# Patient Record
Sex: Male | Born: 1970 | Race: White | Hispanic: No | Marital: Married
Health system: Midwestern US, Community
[De-identification: ages and names within clinical notes are randomized; demographics above are authoritative.]

## PROBLEM LIST (undated history)

## (undated) DIAGNOSIS — J189 Pneumonia, unspecified organism: Secondary | ICD-10-CM

## (undated) DIAGNOSIS — I10 Essential (primary) hypertension: Secondary | ICD-10-CM

## (undated) DIAGNOSIS — K219 Gastro-esophageal reflux disease without esophagitis: Secondary | ICD-10-CM

## (undated) DIAGNOSIS — IMO0002 Reserved for concepts with insufficient information to code with codable children: Secondary | ICD-10-CM

## (undated) DIAGNOSIS — M549 Dorsalgia, unspecified: Secondary | ICD-10-CM

## (undated) DIAGNOSIS — I499 Cardiac arrhythmia, unspecified: Secondary | ICD-10-CM

## (undated) DIAGNOSIS — K621 Rectal polyp: Secondary | ICD-10-CM

## (undated) DIAGNOSIS — M545 Low back pain, unspecified: Secondary | ICD-10-CM

## (undated) DIAGNOSIS — I509 Heart failure, unspecified: Secondary | ICD-10-CM

## (undated) DIAGNOSIS — I729 Aneurysm of unspecified site: Secondary | ICD-10-CM

## (undated) DIAGNOSIS — G8929 Other chronic pain: Secondary | ICD-10-CM

## (undated) HISTORY — PX: LACERATION REPAIR: SHX5168

## (undated) HISTORY — PX: GROIN EXPLORATION: SHX1713

## (undated) HISTORY — PX: OTHER SURGICAL HISTORY: SHX169

## (undated) HISTORY — PX: CHOLECYSTECTOMY: SHX55

---

## 2002-01-25 ENCOUNTER — Emergency Department (HOSPITAL_COMMUNITY): Admission: EM | Admit: 2002-01-25 | Discharge: 2002-01-25 | Payer: Self-pay | Admitting: Emergency Medicine

## 2002-05-19 ENCOUNTER — Emergency Department (HOSPITAL_COMMUNITY): Admission: EM | Admit: 2002-05-19 | Discharge: 2002-05-19 | Payer: Self-pay | Admitting: Emergency Medicine

## 2002-07-15 ENCOUNTER — Emergency Department (HOSPITAL_COMMUNITY): Admission: EM | Admit: 2002-07-15 | Discharge: 2002-07-15 | Payer: Self-pay | Admitting: Emergency Medicine

## 2002-07-15 ENCOUNTER — Encounter: Payer: Self-pay | Admitting: Emergency Medicine

## 2002-08-06 ENCOUNTER — Emergency Department (HOSPITAL_COMMUNITY): Admission: EM | Admit: 2002-08-06 | Discharge: 2002-08-06 | Payer: Self-pay | Admitting: *Deleted

## 2002-08-06 ENCOUNTER — Encounter: Payer: Self-pay | Admitting: *Deleted

## 2002-09-13 ENCOUNTER — Emergency Department (HOSPITAL_COMMUNITY): Admission: EM | Admit: 2002-09-13 | Discharge: 2002-09-13 | Payer: Self-pay | Admitting: Emergency Medicine

## 2002-10-09 ENCOUNTER — Emergency Department (HOSPITAL_COMMUNITY): Admission: EM | Admit: 2002-10-09 | Discharge: 2002-10-09 | Payer: Self-pay | Admitting: Emergency Medicine

## 2002-12-23 ENCOUNTER — Emergency Department (HOSPITAL_COMMUNITY): Admission: EM | Admit: 2002-12-23 | Discharge: 2002-12-23 | Payer: Self-pay | Admitting: Emergency Medicine

## 2003-01-13 ENCOUNTER — Emergency Department (HOSPITAL_COMMUNITY): Admission: EM | Admit: 2003-01-13 | Discharge: 2003-01-13 | Payer: Self-pay | Admitting: Emergency Medicine

## 2003-01-30 ENCOUNTER — Emergency Department (HOSPITAL_COMMUNITY): Admission: EM | Admit: 2003-01-30 | Discharge: 2003-01-30 | Payer: Self-pay | Admitting: Emergency Medicine

## 2003-03-26 ENCOUNTER — Inpatient Hospital Stay (HOSPITAL_COMMUNITY): Admission: AD | Admit: 2003-03-26 | Discharge: 2003-03-29 | Payer: Self-pay | Admitting: General Surgery

## 2003-04-05 ENCOUNTER — Emergency Department (HOSPITAL_COMMUNITY): Admission: EM | Admit: 2003-04-05 | Discharge: 2003-04-05 | Payer: Self-pay | Admitting: Emergency Medicine

## 2003-05-04 ENCOUNTER — Emergency Department (HOSPITAL_COMMUNITY): Admission: EM | Admit: 2003-05-04 | Discharge: 2003-05-04 | Payer: Self-pay | Admitting: Emergency Medicine

## 2003-05-16 ENCOUNTER — Emergency Department (HOSPITAL_COMMUNITY): Admission: EM | Admit: 2003-05-16 | Discharge: 2003-05-16 | Payer: Self-pay | Admitting: Emergency Medicine

## 2003-05-31 ENCOUNTER — Emergency Department (HOSPITAL_COMMUNITY): Admission: EM | Admit: 2003-05-31 | Discharge: 2003-05-31 | Payer: Self-pay | Admitting: *Deleted

## 2003-07-04 ENCOUNTER — Emergency Department (HOSPITAL_COMMUNITY): Admission: EM | Admit: 2003-07-04 | Discharge: 2003-07-04 | Payer: Self-pay | Admitting: Emergency Medicine

## 2003-08-30 ENCOUNTER — Encounter: Admission: RE | Admit: 2003-08-30 | Discharge: 2003-08-30 | Payer: Self-pay | Admitting: Neurosurgery

## 2003-11-08 ENCOUNTER — Encounter: Admission: RE | Admit: 2003-11-08 | Discharge: 2003-11-08 | Payer: Self-pay | Admitting: Neurosurgery

## 2003-12-20 ENCOUNTER — Encounter: Admission: RE | Admit: 2003-12-20 | Discharge: 2003-12-20 | Payer: Self-pay | Admitting: Neurosurgery

## 2004-11-10 ENCOUNTER — Emergency Department (HOSPITAL_COMMUNITY): Admission: EM | Admit: 2004-11-10 | Discharge: 2004-11-10 | Payer: Self-pay | Admitting: Emergency Medicine

## 2005-01-19 ENCOUNTER — Emergency Department (HOSPITAL_COMMUNITY): Admission: EM | Admit: 2005-01-19 | Discharge: 2005-01-19 | Payer: Self-pay | Admitting: Emergency Medicine

## 2005-03-26 ENCOUNTER — Ambulatory Visit (HOSPITAL_COMMUNITY)
Admission: RE | Admit: 2005-03-26 | Discharge: 2005-03-26 | Payer: Self-pay | Admitting: Physical Medicine and Rehabilitation

## 2006-01-10 ENCOUNTER — Emergency Department (HOSPITAL_COMMUNITY): Admission: EM | Admit: 2006-01-10 | Discharge: 2006-01-10 | Payer: Self-pay | Admitting: Emergency Medicine

## 2006-05-03 ENCOUNTER — Emergency Department (HOSPITAL_COMMUNITY): Admission: EM | Admit: 2006-05-03 | Discharge: 2006-05-03 | Payer: Self-pay | Admitting: Emergency Medicine

## 2006-06-07 ENCOUNTER — Emergency Department (HOSPITAL_COMMUNITY): Admission: EM | Admit: 2006-06-07 | Discharge: 2006-06-07 | Payer: Self-pay | Admitting: Emergency Medicine

## 2006-08-26 ENCOUNTER — Emergency Department (HOSPITAL_COMMUNITY): Admission: EM | Admit: 2006-08-26 | Discharge: 2006-08-26 | Payer: Self-pay | Admitting: Emergency Medicine

## 2007-02-19 ENCOUNTER — Emergency Department (HOSPITAL_COMMUNITY): Admission: EM | Admit: 2007-02-19 | Discharge: 2007-02-19 | Payer: Self-pay | Admitting: Emergency Medicine

## 2008-06-27 ENCOUNTER — Observation Stay (HOSPITAL_COMMUNITY): Admission: EM | Admit: 2008-06-27 | Discharge: 2008-06-29 | Payer: Self-pay | Admitting: Emergency Medicine

## 2009-09-20 ENCOUNTER — Emergency Department (HOSPITAL_COMMUNITY): Admission: EM | Admit: 2009-09-20 | Discharge: 2009-09-20 | Payer: Self-pay | Admitting: Emergency Medicine

## 2010-02-22 ENCOUNTER — Encounter: Payer: Self-pay | Admitting: Neurosurgery

## 2010-02-22 ENCOUNTER — Encounter: Payer: Self-pay | Admitting: Physical Medicine and Rehabilitation

## 2010-04-16 LAB — URINALYSIS, ROUTINE W REFLEX MICROSCOPIC
Glucose, UA: NEGATIVE mg/dL
Specific Gravity, Urine: >1.03 — ABNORMAL HIGH (ref 1.005–1.030)
Urobilinogen, UA: 0.2 mg/dL (ref 0.0–1.0)

## 2010-04-16 LAB — BASIC METABOLIC PANEL
BUN: 14 mg/dL (ref 6–23)
CO2: 27 mEq/L (ref 19–32)
Calcium: 8.7 mg/dL (ref 8.4–10.5)
Chloride: 101 mEq/L (ref 96–112)
Creatinine, Ser: 0.88 mg/dL (ref 0.4–1.5)
GFR calc Af Amer: >60 mL/min (ref >60)
Glucose, Bld: 90 mg/dL (ref 70–99)
Sodium: 135 mEq/L (ref 135–145)

## 2010-04-16 LAB — DIFFERENTIAL
Basophils Relative: 0 % (ref 0–1)
Eosinophils Relative: 1 % (ref 0–5)
Lymphs Abs: 2.4 10*3/uL (ref 0.7–4.0)
Monocytes Absolute: 0.7 10*3/uL (ref 0.1–1.0)
Monocytes Relative: 5 % (ref 3–12)
Neutrophils Relative %: 76 % (ref 43–77)

## 2010-04-16 LAB — CBC
HCT: 42.6 % (ref 39.0–52.0)
MCV: 93.4 fL (ref 78.0–100.0)
RDW: 13.2 % (ref 11.5–15.5)

## 2010-04-16 LAB — URINE CULTURE: Culture  Setup Time: 201108212245

## 2010-05-12 LAB — POCT I-STAT 4, (NA,K, GLUC, HGB,HCT)
HCT: 36 % — ABNORMAL LOW (ref 39.0–52.0)
Hemoglobin: 12.2 g/dL — ABNORMAL LOW (ref 13.0–17.0)
Potassium: 4.3 mEq/L (ref 3.5–5.1)

## 2010-05-12 LAB — CBC
Hemoglobin: 14 g/dL (ref 13.0–17.0)
MCHC: 34.2 g/dL (ref 30.0–36.0)
RDW: 13.2 % (ref 11.5–15.5)
WBC: 10.3 10*3/uL (ref 4.0–10.5)

## 2010-05-12 LAB — DIFFERENTIAL
Basophils Absolute: 0 10*3/uL (ref 0.0–0.1)
Basophils Relative: 0 % (ref 0–1)
Eosinophils Absolute: 0.2 10*3/uL (ref 0.0–0.7)
Eosinophils Relative: 2 % (ref 0–5)
Lymphocytes Relative: 24 % (ref 12–46)
Lymphs Abs: 2.5 10*3/uL (ref 0.7–4.0)
Monocytes Relative: 6 % (ref 3–12)
Neutrophils Relative %: 67 % (ref 43–77)

## 2010-05-12 LAB — SAMPLE TO BLOOD BANK

## 2010-05-12 LAB — HEMOGLOBIN AND HEMATOCRIT, BLOOD: HCT: 36 % — ABNORMAL LOW (ref 39.0–52.0)

## 2010-06-16 NOTE — Op Note (Signed)
John Clark, John Clark NO.:  [id number]   MEDICAL RECORD NO.:  [id number]          PATIENT TYPE:  INP   LOCATION:  5005                         FACILITY:  [facility]   PHYSICIAN:  Madelynn Done, MD  DATE OF BIRTH:  [birth year]   DATE OF PROCEDURE:  06/27/2008  DATE OF DISCHARGE:                               OPERATIVE REPORT   PREOPERATIVE DIAGNOSIS:  Complex right forearm laceration, traumatic  laceration with tendon involvement.   POSTOPERATIVE DIAGNOSIS:  Complex right forearm laceration, traumatic  laceration with tendon involvement.   ATTENDING PHYSICIAN:  Dr. Sharma Covert IV, MD, who scrubbed and was  present for the entire procedure.   SURGICAL PROCEDURES:  1. Repair of complex traumatic laceration measuring 23 cm.  2. Repair right forearm ulnar artery, primary arterial repair      peripheral artery.  3. Repair right FCU muscle tendon, right forearm flexor, wrist flexor.  4. Right forearm ulnar nerve exploration, and forearm decompression      and neurolysis.  5. Right median nerve forearm exploration, decompression, and      neurolysis.   ANESTHESIA:  General via endotracheal tube.   TOURNIQUET TIME:  Less than 1/2 hour, 250 mmHg.   INDICATIONS:  Mr. Sevillano is a 40 year old right-hand-dominant gentleman  who had a large piece of glass come down on his right forearm, presented  to EMS with obvious wounds to his forearm with significant hemorrhage.  He was seen and evaluated in the emergency department and given his  injury, it was recommended that he undergo the above procedure.  Risks,  benefits, and alternatives were discussed in detail with the patient and  signed informed consent was obtained.  Risks include but not limited to  bleeding, infection, damage to nearby nerves, arteries, or tendons, loss  of motion of the digits, persistent pain in the hand and need for  further surgical intervention.   DESCRIPTION OF PROCEDURE:  The patient was  brought and identified in the  preoperative holding area, mark with permanent marker was made on the  right forearm to indicate correct operative site.  The patient was  brought back to the operating room, placed supine on the anesthesia room  table.  General anesthesia was administered.  The patient tolerated this  well.  A well-padded tourniquet was then placed on the right brachium  and sealed with a 1000 drape.  The right upper extremities were then  prepped and draped in normal sterile fashion.  The traumatic laceration  was measured to be 23 cm in its great length.  This was then extended  proximally.  The tourniquet was insufflated.  Dissection was carried  down in the forearm.  The fascial layer was then incised.  Portions of  this was done distally in order to obtain a healthy area out of the zone  of injury.  After the release of the forearm fascia, the contents of the  forearm were explored.  The patient's laceration was in the proximal  third of the forearm involving the pronator teres, FCR, and FCU region.  It extended into the  FDS and FDP, but it did not.  The ulnar nerve was  then explored.  The ulnar nerve was in continuity proximally and  distally and a neurolysis was carried out and decompression in the level  of the forearm.  After this, attention was then turned to median nerve.  A similar technique decompression and neurolysis was then carried out of  the median nerve and this was noted to be in continuity.  After both  nerves were explored and decompressed, the tourniquet was then deflated.  The patient had a strong radial pulse.  The ulnar artery was lacerated,  both ends of the artery were then identified and using small vessel  clamps and the vessel was then repaired in the end with a 6-0 Prolene  suture.  The both ends of the artery were adequately prepared, blood  clot was removed from the proximal and distal ends.  There was good flow  and back flow at the  artery and using the Doppler, there was a good  waveform at the ulnar artery at the level of the wrist and a good flow  with Allen's testing following repair the ulnar artery.  Finally, the  attention was then turned to the FCU where the FCU was lacerated in the  proximal forearm.  This was repaired with a 4-0 FiberWire suture and  several horizontal figure-of-eight sutures.  The remaining muscle  bellies of the pronator teres and FCR were reapproximated with 2-0  Vicryl.  The wounds were then thoroughly irrigated.  The subcutaneous  tissues were then closed with a 2-0 Vicryl and the traumatic laceration  was then closed with Vicryl sutures in a layered closure as well as the  skin staples, 20 mL of 0.25% Marcaine was infiltrated.  A Xeroform  dressing and sterile compressive bandage was then applied.  The patient  was placed on a sugar tong splint, extubated and taken to recovery room  in good condition.   POSTOPERATIVE PLAN:  The patient will be admitted for overnight for IV  antibiotics, pain control will be discharged in the morning, seen back  in the office in 10 days.  No workup for the first 10 days and begin an  outpatient therapy program, getting back the use and function of the  right upper extremity.      Madelynn Done, MD  Electronically Signed     FWO/MEDQ  D:  06/27/2008  T:  06/28/2008  Job:  [id number]

## 2010-06-19 NOTE — Discharge Summary (Signed)
NAME:  John Clark, John Clark                          ACCOUNT NO.:  [id number]   MEDICAL RECORD NO.:  [id number]                   PATIENT TYPE:  INP   LOCATION:  A331                                 FACILITY:  [facility]   PHYSICIAN:  Dirk Dress. Katrinka Blazing, M.D.                DATE OF BIRTH:  29-Oct-1970   DATE OF ADMISSION:  03/26/2003  DATE OF DISCHARGE:  03/29/2003                                 DISCHARGE SUMMARY   DISCHARGE DIAGNOSES:  1. Acute gastritis.  2. Rectal polyps.  3. Internal hemorrhoids.  4. Gastroesophageal reflux disease.  5. Chronic thoracic and lumbar pain.   SPECIAL PROCEDURE:  1. Esophagogastroduodenoscopy.  2. Colonoscopy with biopsy of rectal polyp.   DISPOSITION:  The patient is discharged home in stable and satisfactory  condition.   DISCHARGE MEDICATIONS:  1. Protonix 40 mg daily.  2. Levsin 0.125 mg a.c. and h.s.  3. Tylox one or two every four hours as needed for pain.   DISCHARGE INSTRUCTIONS:  1. He is advised to have sitz baths twice a day.  2. Followup in the office in two weeks.   SUMMARY:  A 40 year old male who was admitted with recurrent abdominal pain  with rectal bleeding.  He had the onset of abdominal pain on Thursday of the  week prior to admission.  The pain was very severe. He had multiple episodes  of rectal bleeding over the next three days.  There was evidence of some  weight loss.  He was seen in the emergency room at [facility].  He  was told that he had gastroesophageal reflux and was discharged.  He became  symptomatic.  He was seen in the office where he was noted to be in severe  distress.  He was admitted for control of pain and for evaluation of bloody  diarrhea.  The patient was admitted, started on analgesics, IV fluids, IV  Protonix.  He was given IV Phenergan.  Ultrasound of the abdomen was normal.  EGD showed antral gastritis.  Colonoscopy showed rectal polyps and internal  hemorrhoids.  The patient improved over the next 24  hours.  He was  discharged home with plans for follow up as an outpatient.     ___________________________________________                                         Dirk Dress. Katrinka Blazing, M.D.   LCS/MEDQ  D:  04/27/2003  T:  04/28/2003  Job:  [id number]

## 2010-06-19 NOTE — H&P (Signed)
NAME:  John Clark, John Clark                          ACCOUNT NO.:  [id number]   MEDICAL RECORD NO.:  [id number]                   PATIENT TYPE:  INP   LOCATION:  A331                                 FACILITY:  [facility]   PHYSICIAN:  Dirk Dress. Katrinka Blazing, M.D.                DATE OF BIRTH:  10-Oct-1970   DATE OF ADMISSION:  03/26/2003  DATE OF DISCHARGE:                                HISTORY & PHYSICAL   HISTORY OF PRESENT ILLNESS:  The patient is a 40 year old male admitted for  recurrent abdominal pain with rectal bleeding.   The patient states that he had acute onset of abdominal pain on Thursday of  last week.  This was about the 17th of February.  The patient was very  severe.  He went to the bathroom and had rectal bleeding.  He had pain again  on the 18th, 19th and 20th.  He states that he has had bloody stools each  day.  The stools initially are dark and then become lighter.  He has had  nausea but no vomiting.  He has had anorexia.  There is a reported weight  loss from 250 pounds to 213 pounds over the interim.  This weight loss is  undocumented, however.   The patient was seen at the emergency room at [facility] in [place] on  February 21.  He was diagnosed as having GE reflux and was discharged on  Zantac.  At that time, abdominal series was done as well as chest x-ray  which were normal.   LABORATORY DATA:  Basic metabolic panel, liver panel which was normal, and  CBC which revealed a hemoglobin of 16 with a white count of 8.  Amylase and  lipase were also normal on that visit.   The patient's pain became more severe.  He was seen in our office today in  extreme pain.  He was admitted for evaluation of unexplained abdominal pain  with bloody diarrhea.   PAST MEDICAL HISTORY:  He has no major medical illness.   CURRENT MEDICATIONS:  He takes no chronic medications.   He has not seen a physician for the past 16 years for any type of medical  problems.  His only other  complaint in chronic back pain with a growing mass  in the mid portion of the thoracic spine which has become increasingly more  uncomfortable.  He has not had previous surgery.   FAMILY HISTORY:  Negative for any chronic illness that he is aware of.  There is no family history of colon cancer.   SOCIAL HISTORY:  He is married.  He works as a Systems developer.  He smokes  one pack of cigarettes per day.  He does not drink alcohol.  He denies drug  use.   PHYSICAL EXAMINATION:  GENERAL:  The patient has had morphine, so he is in  no acute distress at this  time.  HEENT:  Unremarkable except for poor dental hygiene.  NECK:  Supple.  No JVD, adenopathy, thyromegaly or masses.  BACK:  Cervical spine appears to be intact and nontender.  CHEST:  Clear to auscultation.  HEART:  Regular rate and rhythm without murmur, gallop or rub.  ABDOMEN:  Nondistended.  Soft with epigastric and hypogastric tenderness,  more to the right lower quadrant than the left lower quadrant.  Normoactive  bowel sounds.  No palpable masses.  No guarding.  EXTREMITIES:  Mild tenderness of knees.  No cyanosis, clubbing or edema.  Callous formation of knees probably from his job (where he crawls a lot).  NEUROLOGIC:  He is alert and oriented.  Cranial nerves are intact.  Deep  tendon reflexes are intact.  RECTAL:  Not repeated since it was done in the office.  Stool was guaiac  negative at that time.  BACK:  Diffuse tenderness of the thoracic and lumbar spine, poorly localized  but with a palpable soft-tissue mass, mid thoracic spine at about T6-T7  which is soft, pliable but not mobile.   IMPRESSION:  1. Recurrent epigastric and lower abdominal pain with associated     hematochezia, to be further evaluated.  2. Chronic back pain with suspected early arthritis, to be further     evaluated.  3. Lipoma of back, symptomatic.   PLAN:  1. The patient will be started on IV Protonix.  2. He will receive morphine for  pain, Phenergan for nausea.  3. He will undergo bowel prep and will have EKG and colonoscopy on February     24.  4. While he is here, we will also do thoracic and lumbar spine series.     ___________________________________________                                         Dirk Dress Katrinka Blazing, M.D.   LCS/MEDQ  D:  03/26/2003  T:  03/26/2003  Job:  [id number]

## 2010-07-23 ENCOUNTER — Emergency Department (HOSPITAL_COMMUNITY): Payer: Self-pay

## 2010-07-23 ENCOUNTER — Inpatient Hospital Stay (HOSPITAL_COMMUNITY)
Admission: AD | Admit: 2010-07-23 | Discharge: 2010-07-24 | DRG: 287 | Disposition: A | Discharge status: Home / Self Care | Payer: Self-pay | Source: Other Acute Inpatient Hospital | Attending: Cardiovascular Disease | Admitting: Cardiovascular Disease

## 2010-07-23 ENCOUNTER — Emergency Department (HOSPITAL_COMMUNITY)
Admission: EM | Admit: 2010-07-23 | Discharge: 2010-07-23 | Disposition: A | Discharge status: Other Acute Inpatient Hospital | Payer: Self-pay | Source: Home / Self Care | Attending: Emergency Medicine | Admitting: Emergency Medicine

## 2010-07-23 DIAGNOSIS — R079 Chest pain, unspecified: Secondary | ICD-10-CM

## 2010-07-23 DIAGNOSIS — F1210 Cannabis abuse, uncomplicated: Secondary | ICD-10-CM | POA: Diagnosis present

## 2010-07-23 DIAGNOSIS — R062 Wheezing: Secondary | ICD-10-CM | POA: Insufficient documentation

## 2010-07-23 DIAGNOSIS — R0609 Other forms of dyspnea: Secondary | ICD-10-CM | POA: Insufficient documentation

## 2010-07-23 DIAGNOSIS — G8929 Other chronic pain: Secondary | ICD-10-CM | POA: Diagnosis present

## 2010-07-23 DIAGNOSIS — R9431 Abnormal electrocardiogram [ECG] [EKG]: Secondary | ICD-10-CM | POA: Insufficient documentation

## 2010-07-23 DIAGNOSIS — K219 Gastro-esophageal reflux disease without esophagitis: Secondary | ICD-10-CM | POA: Diagnosis present

## 2010-07-23 DIAGNOSIS — K620 Anal polyp: Secondary | ICD-10-CM | POA: Diagnosis present

## 2010-07-23 DIAGNOSIS — R0989 Other specified symptoms and signs involving the circulatory and respiratory systems: Secondary | ICD-10-CM | POA: Insufficient documentation

## 2010-07-23 DIAGNOSIS — I319 Disease of pericardium, unspecified: Secondary | ICD-10-CM | POA: Diagnosis present

## 2010-07-23 DIAGNOSIS — K648 Other hemorrhoids: Secondary | ICD-10-CM | POA: Diagnosis present

## 2010-07-23 DIAGNOSIS — F17200 Nicotine dependence, unspecified, uncomplicated: Secondary | ICD-10-CM | POA: Diagnosis present

## 2010-07-23 LAB — RAPID URINE DRUG SCREEN, HOSP PERFORMED
Opiates: POSITIVE — AB
Tetrahydrocannabinol: POSITIVE — AB

## 2010-07-23 LAB — DIFFERENTIAL
Basophils Absolute: 0.1 10*3/uL (ref 0.0–0.1)
Basophils Relative: 0 % (ref 0–1)
Eosinophils Absolute: 0.3 10*3/uL (ref 0.0–0.7)
Monocytes Relative: 5 % (ref 3–12)

## 2010-07-23 LAB — BASIC METABOLIC PANEL
BUN: 16 mg/dL (ref 6–23)
Calcium: 9.4 mg/dL (ref 8.4–10.5)
Chloride: 104 mEq/L (ref 96–112)
GFR calc Af Amer: >60 mL/min (ref >60)
GFR calc non Af Amer: >60 mL/min (ref >60)

## 2010-07-23 LAB — CK TOTAL AND CKMB (NOT AT ARMC)
Relative Index: 2.4 (ref 0.0–2.5)
Total CK: 143 U/L (ref 7–232)

## 2010-07-23 LAB — PROTIME-INR: Prothrombin Time: 13.6 seconds (ref 11.6–15.2)

## 2010-07-23 LAB — CBC
HCT: 44.6 % (ref 39.0–52.0)
MCH: 31.5 pg (ref 26.0–34.0)
MCHC: 34.3 g/dL (ref 30.0–36.0)
RDW: 13 % (ref 11.5–15.5)

## 2010-07-23 LAB — MRSA PCR SCREENING: MRSA by PCR: NEGATIVE

## 2010-07-23 LAB — CARDIAC PANEL(CRET KIN+CKTOT+MB+TROPI)
CK, MB: 2.7 ng/mL (ref 0.3–4.0)
Total CK: 99 U/L (ref 7–232)
Troponin I: <0.3 ng/mL (ref <0.30)

## 2010-07-23 LAB — TROPONIN I: Troponin I: <0.3 ng/mL (ref <0.30)

## 2010-07-23 LAB — HEPARIN LEVEL (UNFRACTIONATED): Heparin Unfractionated: 0.27 IU/mL — ABNORMAL LOW (ref 0.30–0.70)

## 2010-07-24 DIAGNOSIS — R072 Precordial pain: Secondary | ICD-10-CM

## 2010-07-24 LAB — CARDIAC PANEL(CRET KIN+CKTOT+MB+TROPI)
CK, MB: 2.3 ng/mL (ref 0.3–4.0)
Relative Index: INVALID (ref 0.0–2.5)
Relative Index: INVALID (ref 0.0–2.5)
Total CK: 88 U/L (ref 7–232)
Total CK: 87 U/L (ref 7–232)
Troponin I: <0.3 ng/mL (ref <0.30)
Troponin I: <0.3 ng/mL (ref <0.30)

## 2010-07-24 LAB — CBC
Hemoglobin: 13.9 g/dL (ref 13.0–17.0)
MCHC: 34.2 g/dL (ref 30.0–36.0)
Platelets: 178 10*3/uL (ref 150–400)
RDW: 13.1 % (ref 11.5–15.5)

## 2010-07-24 LAB — LIPID PANEL
LDL Cholesterol: 86 mg/dL (ref 0–99)
LDL Cholesterol: 85 mg/dL (ref 0–99)
VLDL: 25 mg/dL (ref 0–40)
VLDL: 24 mg/dL (ref 0–40)

## 2010-07-24 LAB — COMPREHENSIVE METABOLIC PANEL
ALT: 15 U/L (ref 0–53)
AST: 14 U/L (ref 0–37)
Albumin: 3.3 g/dL — ABNORMAL LOW (ref 3.5–5.2)
CO2: 30 mEq/L (ref 19–32)
Chloride: 103 mEq/L (ref 96–112)
GFR calc non Af Amer: >60 mL/min (ref >60)
Potassium: 4 mEq/L (ref 3.5–5.1)
Sodium: 138 mEq/L (ref 135–145)
Total Bilirubin: 0.2 mg/dL — ABNORMAL LOW (ref 0.3–1.2)

## 2010-07-24 LAB — PROTIME-INR: Prothrombin Time: 13.9 seconds (ref 11.6–15.2)

## 2010-07-24 LAB — HEPARIN LEVEL (UNFRACTIONATED): Heparin Unfractionated: 0.26 IU/mL — ABNORMAL LOW (ref 0.30–0.70)

## 2010-07-26 NOTE — H&P (Signed)
NAMEBRODYN, DEPUY NO.:  [id number]  MEDICAL RECORD NO.:  [id number]  LOCATION:  2915                         FACILITY:  [facility]  PHYSICIAN:  Hillis Range, MD       DATE OF BIRTH:  [birth year]  DATE OF ADMISSION:  07/23/2010 DATE OF DISCHARGE:                             HISTORY & PHYSICAL   PRIMARY CARDIOLOGIST:  [facility], ultimately will follow up in [place].  PATIENT PROFILE:  A 40 year old male without prior cardiac history presents with a 3-day history of chest pain.  PROBLEMS: 1. Chest pain. 2. Ongoing tobacco use. 3. Ongoing marijuana abuse. 4. History of gastritis in March 2005. 5. History of rectal polyps. 6. Gastroesophageal reflux disease. 7. History of chronic thoracic and lumbar pain. 8. History of internal hemorrhoids. 9. History of right forearm laceration status post repair. 10.History of bright red blood per rectum.     a.     The patient states he experiences this 8-10 times per month.  ALLERGIES:  TORADOL.  HISTORY OF PRESENT ILLNESS:  A 40 year old male without prior cardiac history.  He was in his usual state of health for the last 3-4 weeks ago when he had a tick bite between his toes.  The tick was removed without incident.  He did note that the area was purple for a few days, but has since resolved.  He did not develop any fevers, chills, or joint aches. Approximately 2 weeks ago he had an episode of chest pain which was brief in nature and he did not think much of it.  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  At approximately 3:00 a.m. on June 19, the patient woke with 8/10 chest pain that was worse with deep breathing, coughing and lying flat.  Pain was also worse with some extent to position changes such as sitting up, but once he is up he would feel better.  The pain has been constant over the past 2-1/2 days  ranging from 6-8/10.  He is not taking anything at home for it and today presents to the [facility] ED where his ECG showed diffuse J-point elevation with negative cardiac markers suspicious for pericarditis.  The patient is transferred to [facility] for further evaluation.  He currently complains of 7/10 chest pain.  HOME MEDICATIONS:  None.  FAMILY HISTORY:  His mother is alive and well.  He says he doe not know anything about his father.  He says he has no siblings.  He was raised by his grandparents  SOCIAL HISTORY:  The patient lives in [place] with his son.  He works in Sales promotion account executive.  He has a 22+ pack years of tobacco abuse, smoking a pack and half a day currently.  He seldom has an alcoholic beverage and smokes marijuana about 1 time a month.  He does not routinely exercise.  REVIEW OF SYSTEMS:  Positive for chest pain, dyspnea, cough.  The patient was cold upon arrival from [facility].  He had no fevers.  He says he has a dark red blood per rectum about  8-10 times per month. Usually when it occurs, it will occur in a spell that lasts about 3 days and just resolves on its own.  He is a full code.  Otherwise, all systems reviewed negative.  PHYSICAL EXAMINATION:  VITAL SIGNS:  He is afebrile, heart rate is 62, respirations 15, blood pressure 120/78, pulse ox 99% on 2 L.  He has no pulsus paradoxus. GENERAL:  A pleasant white male in no acute distress.  Awake, alert and oriented x3.  He has a normal affect. HEENT:  Normal.  Nares grossly nares grossly intact, nonfocal. SKIN:  Warm and dry without lesions or masses. NECK:  Supple without bruits or JVD. LUNGS:  Respirations are regular and unlabored.  Clear to auscultation. CARDIAC:  Regular, S1 and S2.  No S3, S4, murmurs or rubs. ABDOMEN:  Round, soft, nontender, and nondistended.  Bowel sounds present x4. EXTREMITIES:  Warm, dry, and pink.  No clubbing, cyanosis, or edema. Dorsalis pedis and posterior tibial pulses 2+ equal  bilaterally.  His chest x-ray shows minimal central peribronchial thickening, question associated bronchitis, asthma or reactive airway disease.  Lab work; hemoglobin 15.3, hematocrit 44.6, WBC 11.2, platelets 203.  Sodium 139, potassium 4.1, chloride 104, CO2 22, BUN 16, creatinine 0.98, glucose 115, CK 143, MB 3.5, troponin-I less than 0.3.  Urine drug screen is positive for opiates and THC.  INR 1.02.  ASSESSMENT: 1. Chest. Pain.  The patient presents with greater than 48-hour     history of chest pain that is worse with breathing, coughing, lying     flat and to some extent to palpation and position changes.  ECG     shows ectopic low atrial rhythm with short PR as well as sinus     rhythm and diffuse J-point elevation approximately 1 to 1.5 mm,     suspicious for pericarditis.  The patient has had no recent fevers     or chills but does report tick bite last month.  We will add     colchicine and check 2-D echocardiogram.  We will monitor enzymes     at this point as the patient reports a history of bright red blood     per rectum.  Of note, the patient also reports history of     occasional exertional chest pain while working as a Nutritional therapist.  With     abnormal EKG and this history of chest pain, we will also plan on     catheterization tomorrow. 2. Tobacco abuse.  Smoking cessation strongly advised. 3. History of bright red blood per rectum.  Add PPI. Guaiac     stools.  H and H is 15 and 44.  Continue to follow.  Likely will     require outpatient GI evaluation.     Nicolasa Ducking, ANP   ______________________________ Hillis Range, MD    CB/MEDQ  D:  07/23/2010  T:  07/24/2010  Job:  [id number]  Electronically Signed by Nicolasa Ducking ANP on 07/24/2010 02:37:22 PM Electronically Signed by Hillis Range MD on 07/26/2010 04:56:28 PM

## 2010-07-28 ENCOUNTER — Emergency Department (HOSPITAL_COMMUNITY): Admission: EM | Admit: 2010-07-28 | Payer: Self-pay | Source: Home / Self Care

## 2010-07-28 ENCOUNTER — Emergency Department (HOSPITAL_COMMUNITY)
Admission: EM | Admit: 2010-07-28 | Discharge: 2010-07-28 | Disposition: A | Discharge status: Home / Self Care | Payer: Self-pay | Attending: Emergency Medicine | Admitting: Emergency Medicine

## 2010-07-28 DIAGNOSIS — F411 Generalized anxiety disorder: Secondary | ICD-10-CM | POA: Insufficient documentation

## 2010-07-28 DIAGNOSIS — R079 Chest pain, unspecified: Secondary | ICD-10-CM | POA: Insufficient documentation

## 2010-07-28 LAB — CBC
HCT: 41.4 % (ref 39.0–52.0)
Hemoglobin: 14.7 g/dL (ref 13.0–17.0)
MCH: 31.9 pg (ref 26.0–34.0)
MCHC: 35.5 g/dL (ref 30.0–36.0)
RDW: 12.8 % (ref 11.5–15.5)

## 2010-07-28 LAB — DIFFERENTIAL
Basophils Absolute: 0.1 10*3/uL (ref 0.0–0.1)
Basophils Relative: 1 % (ref 0–1)
Eosinophils Relative: 3 % (ref 0–5)
Lymphocytes Relative: 24 % (ref 12–46)
Monocytes Absolute: 0.7 10*3/uL (ref 0.1–1.0)
Monocytes Relative: 7 % (ref 3–12)
Neutro Abs: 6.5 10*3/uL (ref 1.7–7.7)

## 2010-07-28 LAB — BASIC METABOLIC PANEL
BUN: 20 mg/dL (ref 6–23)
Calcium: 9.8 mg/dL (ref 8.4–10.5)
GFR calc Af Amer: >60 mL/min (ref >60)
GFR calc non Af Amer: >60 mL/min (ref >60)
Glucose, Bld: 99 mg/dL (ref 70–99)
Potassium: 3.6 mEq/L (ref 3.5–5.1)

## 2010-08-11 NOTE — Cardiovascular Report (Signed)
  NAMEALISTAIR, SENFT NO.:  [id number]  MEDICAL RECORD NO.:  [id number]  LOCATION:  2807                         FACILITY:  [facility]  PHYSICIAN:  Noralyn Pick. Eden Emms, MD, FACCDATE OF BIRTH:  August 10, 1970  DATE OF PROCEDURE:  07/24/2010 DATE OF DISCHARGE:                           CARDIAC CATHETERIZATION   PROCEDURE:  Cardiac catheterization.  OPERATOR:  Noralyn Pick. Eden Emms, MD, [facility]  INDICATIONS:  This is a 40 year old patient with chest pain. Catheterization done to rule out coronary disease.  TECHNIQUE:  Catheterization was done with 5-French catheter from right femoral artery.  FINDINGS:  Left main coronary artery was normal.  Left anterior descending artery was normal.  First and second diagonal branches were normal.  Circumflex coronary artery was nondominant.  There was a high takeoff of the first obtuse marginal branch which was normal.  Second obtuse marginal branch was normal.  There was a small AV groove branch.  Right coronary artery was dominant.  It was normal.  There was a normal PDA and two small normal posterolateral branches.  VENTRICULOGRAPHY:  Ventriculography was normal.  EF was 60%.  There was no gradient across the aortic valve.  No MR.  LV pressure was 138/22. Aortic pressure was 130/85.  IMPRESSION:  The patient has no significant coronary artery disease.  He is ruled out myocardial infarction.  He has normal left ventricular function.  He will be discharged later today as long as his groin heals well.     Noralyn Pick. Eden Emms, MD, [facility]     PCN/MEDQ  D:  07/24/2010  T:  07/25/2010  Job:  [id number]  Electronically Signed by Charlton Haws MD [facility] on 08/11/2010 12:52:56 PM

## 2010-08-14 ENCOUNTER — Ambulatory Visit: Payer: Self-pay | Admitting: Adult Health

## 2010-08-18 NOTE — Discharge Summary (Signed)
John Clark, SHERK NO.:  [id number]  MEDICAL RECORD NO.:  [id number]  LOCATION:  2807                         FACILITY:  [facility]  PHYSICIAN:  Madolyn Frieze. Jens Som, MD, FACCDATE OF BIRTH:  09/07/1970  DATE OF ADMISSION:  07/23/2010 DATE OF DISCHARGE:  07/24/2010                              DISCHARGE SUMMARY   PRIMARY CARDIOLOGIST:  Gerrit Friends. Dietrich Pates, MD, [facility] in our [place] office.  DISCHARGE DIAGNOSIS:  Chest pain.  SECONDARY DIAGNOSES: 1. ? Pericarditis. 2. Normal coronary arteries by catheterization. 3. Ongoing tobacco and marijuana abuse. 4. History of gastritis. 5. History of GERD. 6. Rectal polyps. 7. History of internal hemorrhoids with hematochezia. 8. Chronic thoracic and lumbar pain. 9. History of right forearm laceration in 2010. 10.Recent tick bite.  ALLERGIES:  TORADOL.  PROCEDURES:  2-D echocardiogram with result pending at this time.  PROCEDURE:  Left heart cardiac catheterization revealing normal coronary arteries and normal LV function, EF 60% without regional wall motion abnormalities.  HISTORY OF PRESENT ILLNESS:  A 40 year old male without prior cardiac history who was in his usual state of health approximately 3-4 weeks ago and he suffered a tick bite between his toes.  Tick was removed without incidence of person.  The patient noted the bitten area was purple for a few days, but subsequently resolved.  He denies any fevers, chills, or joint aches.  Approximately 2 weeks ago, he had one brief episode of chest discomfort and did not take too much effort.  At approximately 3:00 a.m. on June 19, the patient awoke with 8/10 chest pain that was worse on breathing, cough, lying flat and position changes.  Symptoms persisted for greater than 48 hours prompting to present to the [facility] on June 21, where he was noted on ECG to have significant diffuse J-point elevation of 1 to 1.5 mm.  This was not felt to represent a  code STEMI in the setting of long duration of symptoms and normal cardiac markers.  The patient was transferred to [facility] for further evaluation.  HOSPITAL COURSE:  Following arrival to [facility], cardiac markers were continually cycled and have remained negative.  The patient continued to have chest pain as described in the history of present illness with some relief with colchicine therapy.  The patient reported to [place] that he intermittently notes hematochezia and for that reason, we have not added NSAIDs, but I have otherwise treated him for presumed pericarditis in the setting of a recent tick bite.  We have also covered him with doxycycline 100 mg b.i.d. and plan to continue this for the next 7 days.  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  Because of this with his history of tobacco abuse, we performed diagnostic catheterization which was done this morning showed normal coronary arteries with normal LV function.  The patient had an echocardiogram performed post catheterization and at this time, those results are pending.  Provided those were within normal limits. The patient will be discharged home this evening in good condition.  DISCHARGE LABORATORIES:  Hemoglobin 13.9, hematocrit 40.7, WBC 9.7, platelets 178.  INR 1.05.  Sodium 138, potassium  4.0, chloride 103, CO2 30, BUN 16, creatinine 0.89, glucose 98, total bilirubin 0.2, alkaline phosphatase 63, AST 14, ALT 15, protein 6.1, albumin 3.3, calcium 8.5. Hemoglobin A1c 5.8.  CK 87, MB 2.6, troponin I less than 0.30.  Total cholesterol 141, triglycerides 122, HDL 32, LDL 85.  Drug screen positive for opiates and THC.  MRSA screen was negative.  DISPOSITION:  The patient will be discharged home later today in good condition.  FOLLOWUP PLANS AND APPOINTMENTS:  We will arrange for followup in our [place] office in approximately 2 weeks.  DISCHARGE MEDICATIONS: 1.  Doxycycline 1 mg b.i.d. x7 days. 2. Colchicine 0.6 mg b.i.d.  OUTSTANDING LABS AND STUDIES:  Echocardiogram is pending at the time of this dictation.  DURATION DISCHARGE ENCOUNTER:  45 minutes including physician time.     Nicolasa Ducking, ANP   ______________________________ Madolyn Frieze. Jens Som, MD, [facility]    CB/MEDQ  D:  07/24/2010  T:  07/25/2010  Job:  [id number]  Electronically Signed by Nicolasa Ducking ANP on 08/18/2010 04:47:06 PM Electronically Signed by Olga Millers MD [facility] on 08/18/2010 07:39:24 PM

## 2010-09-17 ENCOUNTER — Emergency Department (HOSPITAL_COMMUNITY)
Admission: EM | Admit: 2010-09-17 | Discharge: 2010-09-17 | Disposition: A | Discharge status: Home / Self Care | Payer: Self-pay | Attending: Emergency Medicine | Admitting: Emergency Medicine

## 2010-09-17 ENCOUNTER — Emergency Department (HOSPITAL_COMMUNITY): Payer: Self-pay

## 2010-09-17 ENCOUNTER — Other Ambulatory Visit: Payer: Self-pay

## 2010-09-17 DIAGNOSIS — W108XXA Fall (on) (from) other stairs and steps, initial encounter: Secondary | ICD-10-CM | POA: Insufficient documentation

## 2010-09-17 DIAGNOSIS — R55 Syncope and collapse: Secondary | ICD-10-CM

## 2010-09-17 DIAGNOSIS — Y92009 Unspecified place in unspecified non-institutional (private) residence as the place of occurrence of the external cause: Secondary | ICD-10-CM | POA: Insufficient documentation

## 2010-09-17 DIAGNOSIS — E860 Dehydration: Secondary | ICD-10-CM

## 2010-09-17 DIAGNOSIS — S39012A Strain of muscle, fascia and tendon of lower back, initial encounter: Secondary | ICD-10-CM

## 2010-09-17 DIAGNOSIS — F17200 Nicotine dependence, unspecified, uncomplicated: Secondary | ICD-10-CM | POA: Insufficient documentation

## 2010-09-17 DIAGNOSIS — K219 Gastro-esophageal reflux disease without esophagitis: Secondary | ICD-10-CM | POA: Insufficient documentation

## 2010-09-17 HISTORY — DX: Low back pain: M54.5

## 2010-09-17 HISTORY — DX: Gastro-esophageal reflux disease without esophagitis: K21.9

## 2010-09-17 HISTORY — DX: Low back pain, unspecified: M54.50

## 2010-09-17 HISTORY — DX: Dorsalgia, unspecified: M54.9

## 2010-09-17 HISTORY — DX: Other chronic pain: G89.29

## 2010-09-17 HISTORY — DX: Rectal polyp: K62.1

## 2010-09-17 HISTORY — DX: Cardiac arrhythmia, unspecified: I49.9

## 2010-09-17 LAB — POCT I-STAT TROPONIN I: Troponin i, poc: 0.01 ng/mL (ref 0.00–0.08)

## 2010-09-17 LAB — CBC
Platelets: 200 10*3/uL (ref 150–400)
RDW: 12.9 % (ref 11.5–15.5)
WBC: 12.2 10*3/uL — ABNORMAL HIGH (ref 4.0–10.5)

## 2010-09-17 LAB — DIFFERENTIAL
Basophils Absolute: 0.1 10*3/uL (ref 0.0–0.1)
Eosinophils Relative: 1 % (ref 0–5)
Lymphocytes Relative: 25 % (ref 12–46)
Neutro Abs: 8 10*3/uL — ABNORMAL HIGH (ref 1.7–7.7)

## 2010-09-17 LAB — URINALYSIS, ROUTINE W REFLEX MICROSCOPIC
Glucose, UA: NEGATIVE mg/dL
Leukocytes, UA: NEGATIVE
Nitrite: NEGATIVE
Protein, ur: NEGATIVE mg/dL
Urobilinogen, UA: 0.2 mg/dL (ref 0.0–1.0)

## 2010-09-17 LAB — BASIC METABOLIC PANEL
CO2: 23 mEq/L (ref 19–32)
Calcium: 9.5 mg/dL (ref 8.4–10.5)
Chloride: 99 mEq/L (ref 96–112)
Sodium: 137 mEq/L (ref 135–145)

## 2010-09-17 MED ORDER — IBUPROFEN 800 MG PO TABS
800.0000 mg | ORAL_TABLET | Freq: Once | ORAL | Status: AC
  Administered 2010-09-17: 800 mg via ORAL
  Filled 2010-09-17: qty 1

## 2010-09-17 MED ORDER — HYDROCODONE-ACETAMINOPHEN 5-325 MG PO TABS: 1.0000 | ORAL_TABLET | Freq: Four times a day (QID) | ORAL | Status: AC | PRN

## 2010-09-17 MED ORDER — IBUPROFEN 800 MG PO TABS: 800.0000 mg | ORAL_TABLET | Freq: Three times a day (TID) | ORAL | Status: AC

## 2010-09-17 MED ORDER — MORPHINE SULFATE 4 MG/ML IJ SOLN
4.0000 mg | Freq: Once | INTRAMUSCULAR | Status: AC
  Administered 2010-09-17: 4 mg via INTRAVENOUS
  Filled 2010-09-17: qty 1

## 2010-09-17 NOTE — ED Provider Notes (Signed)
History     CSN: [id number] Arrival date & time: 09/17/2010 12:48 AM  Chief Complaint  Patient presents with  . Dizziness  . Fall  . Back Pain   HPI Comments: Pt reports feeling bad since Monday, having episodes of dizziness.  Tonight became dizzy and fell out the back door of house.  Fell down 4-5 steps. Now having back pain. Denies loc w. Fall.   Patient states he was recently in the hospital for problems with chest pain and was diagnosed with a pericarditis  Patient is a 40 y.o. male presenting with fall and back pain. The history is provided by the patient.  Fall He fell from a height of 3 to 5 ft. There was no blood loss. Point of impact: Lower back. Pain location: Lower back. The pain is moderate. He was ambulatory at the scene. There was no entrapment after the fall. There was no drug use involved in the accident. There was no alcohol use involved in the accident. Associated symptoms include headaches. Pertinent negatives include no visual change, no fever, no numbness, no bowel incontinence, no nausea, no vomiting, no hematuria, no hearing loss and no tingling.  Back Pain  Associated symptoms include headaches. Pertinent negatives include no fever, no numbness, no bowel incontinence and no tingling.    Past Medical History  Diagnosis Date  . Irregular heartbeat   . Chronic back pain   . Chronic lumbar pain   . GERD (gastroesophageal reflux disease)   . Rectal polyp     Past Surgical History  Procedure Date  . Laceration repair     repair to right arm that severed artery    No family history on file.  History  Substance Use Topics  . Smoking status: Current Everyday Smoker    Types: Cigarettes  . Smokeless tobacco: Not on file  . Alcohol Use: No      Review of Systems  Constitutional: Negative for fever.  Gastrointestinal: Negative for nausea, vomiting and bowel incontinence.  Genitourinary: Negative for hematuria.  Musculoskeletal: Positive for back pain.    Neurological: Positive for headaches. Negative for tingling and numbness.  All other systems reviewed and are negative.    Physical Exam  BP 122/86  Pulse 82  Temp(Src) 98.3 F (36.8 C) (Oral)  Resp 20  Ht 5\' 9"  (1.753 m)  Wt 235 lb (106.595 kg)  BMI 34.70 kg/m2  SpO2 100%  Physical Exam  Nursing note and vitals reviewed. Constitutional: He appears well-developed and well-nourished. No distress.  HENT:  Head: Normocephalic and atraumatic.  Right Ear: External ear normal.  Left Ear: External ear normal.  Eyes: Conjunctivae are normal. Right eye exhibits no discharge. Left eye exhibits no discharge. No scleral icterus.  Neck: Neck supple. No tracheal deviation present.  Cardiovascular: Normal rate, regular rhythm, S1 normal, S2 normal and intact distal pulses.  Exam reveals no gallop, no distant heart sounds and no friction rub.   Pulmonary/Chest: Effort normal and breath sounds normal. No stridor. No respiratory distress. He has no wheezes. He has no rales.  Abdominal: Soft. Bowel sounds are normal. He exhibits no distension. There is no tenderness. There is no rebound and no guarding.  Musculoskeletal: He exhibits no edema and no tenderness.       Right shoulder: Normal.       Left shoulder: Normal.       Right elbow: Normal.      Left elbow: Normal.       Left wrist:  Normal.       Right hip: Normal.       Right knee: Normal.       Left knee: Normal.       Right ankle: Normal.       Left ankle: Normal.        No cervical or thoracic spine tenderness, tenderness mid lumbar spine,, without deformity or step off  Neurological: He is alert. He has normal strength. No sensory deficit. Cranial nerve deficit:  no gross defecits noted. He exhibits normal muscle tone. He displays no seizure activity. Coordination normal.  Skin: Skin is warm and dry. No rash noted.  Psychiatric: He has a normal mood and affect.    ED Course  Procedures  Date: 09/17/2010  Rate: 86  Rhythm:  normal sinus rhythm  QRS Axis: normal  Intervals: normal  ST/T Wave abnormalities: normal  Conduction Disutrbances:none  Narrative Interpretation: normal  Old EKG Reviewed: unchanged  Dg Chest 2 View  09/17/2010  *RADIOLOGY REPORT*  Clinical Data: Fever, dizziness, weakness, fell on cement stairs. Right-sided back pain.  CHEST - 2 VIEW  Comparison: 07/23/2010  Findings: Tiny calcified granulomas. The heart size and pulmonary vascularity are normal. The lungs appear clear and expanded without focal air space disease or consolidation. No blunting of the costophrenic angles.  Degenerative changes in the thoracic spine with mild anterior wedging of mid thoracic vertebra, stable.  No pneumothorax.  No significant changes since the previous study.  IMPRESSION: No evidence of active pulmonary disease.  Original Report Authenticated By: Marlon Pel, M.D.   Dg Lumbar Spine Complete  09/17/2010  *RADIOLOGY REPORT*  Clinical Data: Syncope.  Back pain after fall.  LUMBAR SPINE - COMPLETE 4+ VIEW  Comparison: 06/07/2006  Findings: Normal alignment of the lumbar spine.  No vertebral compression deformities.  Intervertebral disc space heights are preserved.  Facet joints are normally aligned.  No focal bone lesion or destruction.  No significant change since the previous study.  IMPRESSION: No displaced fractures identified.  Original Report Authenticated By: Marlon Pel, M.D.   Labs Reviewed  CBC - Abnormal; Notable for the following:    WBC 12.2 (*)    All other components within normal limits  DIFFERENTIAL - Abnormal; Notable for the following:    Neutro Abs 8.0 (*)    All other components within normal limits  BASIC METABOLIC PANEL - Abnormal; Notable for the following:    Potassium 3.4 (*)    Glucose, Bld 112 (*)    All other components within normal limits  URINALYSIS, ROUTINE W REFLEX MICROSCOPIC  I-STAT TROPONIN I   Old records were reviewed. Patient was recently admitted last  month. He had a normal cardiac catheter. Please see additional notes just below. Patient was discharged on a course of doxycycline  PHYSICIAN: Madolyn Frieze. Jens Som, MD, FACCDATE OF BIRTH: 12-16-1970  DATE OF ADMISSION: 07/23/2010  DATE OF DISCHARGE: 07/24/2010  DISCHARGE SUMMARY  PRIMARY CARDIOLOGIST: Gerrit Friends. Dietrich Pates, MD, [facility] in our [place]  office.  DISCHARGE DIAGNOSIS: Chest pain.  SECONDARY DIAGNOSES:  1. ? Pericarditis.  2. Normal coronary arteries by catheterization.  3. Ongoing tobacco and marijuana abuse.  4. History of gastritis.  5. History of GERD.  6. Rectal polyps.  7. History of internal hemorrhoids with hematochezia.  8. Chronic thoracic and lumbar pain.  9. History of right forearm laceration in 2010.  10.Recent tick bite.  MDM Patient with weakness and syncopal episode. Suspect this may be related to his  recent illness possibly viral in etiology. He was treated appears clear for tickborne illnesses. There does not seem to be any evidence of rash to suggest that at this point. No neck stiffness to suggest meningitis no sign of pneumonia urinary tract infection. Patient may be mildly hydrated will encourage her to drink plenty of fluids regarding his back injury there does not seem to be any sign of fracture we'll treat for muscle strain and contusion.      Celene Kras, MD 09/17/10 [phone]

## 2010-09-17 NOTE — ED Notes (Signed)
Pt reports feeling bad since Monday, having episodes of dizziness.  Tonight became dizzy and fell out the back door of house.  Fell down 4-5 steps. Now having back pain. Denies loc w. Fall.

## 2010-10-16 ENCOUNTER — Encounter (HOSPITAL_COMMUNITY): Payer: Self-pay | Admitting: *Deleted

## 2010-10-16 ENCOUNTER — Emergency Department (HOSPITAL_COMMUNITY)
Admission: EM | Admit: 2010-10-16 | Discharge: 2010-10-17 | Disposition: A | Discharge status: Home / Self Care | Payer: Self-pay | Attending: Emergency Medicine | Admitting: Emergency Medicine

## 2010-10-16 DIAGNOSIS — J3489 Other specified disorders of nose and nasal sinuses: Secondary | ICD-10-CM | POA: Insufficient documentation

## 2010-10-16 DIAGNOSIS — IMO0001 Reserved for inherently not codable concepts without codable children: Secondary | ICD-10-CM | POA: Insufficient documentation

## 2010-10-16 DIAGNOSIS — R509 Fever, unspecified: Secondary | ICD-10-CM | POA: Insufficient documentation

## 2010-10-16 DIAGNOSIS — R062 Wheezing: Secondary | ICD-10-CM | POA: Insufficient documentation

## 2010-10-16 DIAGNOSIS — B349 Viral infection, unspecified: Secondary | ICD-10-CM

## 2010-10-16 DIAGNOSIS — R0602 Shortness of breath: Secondary | ICD-10-CM | POA: Insufficient documentation

## 2010-10-16 DIAGNOSIS — R079 Chest pain, unspecified: Secondary | ICD-10-CM | POA: Insufficient documentation

## 2010-10-16 DIAGNOSIS — D72829 Elevated white blood cell count, unspecified: Secondary | ICD-10-CM | POA: Insufficient documentation

## 2010-10-16 DIAGNOSIS — R05 Cough: Secondary | ICD-10-CM | POA: Insufficient documentation

## 2010-10-16 DIAGNOSIS — R059 Cough, unspecified: Secondary | ICD-10-CM | POA: Insufficient documentation

## 2010-10-16 DIAGNOSIS — R51 Headache: Secondary | ICD-10-CM | POA: Insufficient documentation

## 2010-10-16 NOTE — ED Notes (Signed)
Fever, cough, chills

## 2010-10-17 ENCOUNTER — Emergency Department (HOSPITAL_COMMUNITY): Payer: Self-pay

## 2010-10-17 MED ORDER — SODIUM CHLORIDE 0.9 % IV BOLUS (SEPSIS)
1000.0000 mL | Freq: Once | INTRAVENOUS | Status: AC
  Administered 2010-10-17: 1000 mL via INTRAVENOUS

## 2010-10-17 MED ORDER — SODIUM CHLORIDE 0.9 % IN NEBU
INHALATION_SOLUTION | RESPIRATORY_TRACT | Status: AC
  Filled 2010-10-17: qty 3

## 2010-10-17 MED ORDER — MORPHINE SULFATE 2 MG/ML IJ SOLN
2.0000 mg | Freq: Once | INTRAMUSCULAR | Status: AC
  Administered 2010-10-17: 2 mg via INTRAVENOUS
  Filled 2010-10-17: qty 1

## 2010-10-17 MED ORDER — ALBUTEROL SULFATE (5 MG/ML) 0.5% IN NEBU
2.5000 mg | INHALATION_SOLUTION | Freq: Once | RESPIRATORY_TRACT | Status: AC
  Administered 2010-10-17: 2.5 mg via RESPIRATORY_TRACT
  Filled 2010-10-17: qty 0.5

## 2010-10-17 MED ORDER — ACETAMINOPHEN 325 MG PO TABS
650.0000 mg | ORAL_TABLET | Freq: Once | ORAL | Status: AC
  Administered 2010-10-17: 650 mg via ORAL
  Filled 2010-10-17: qty 2

## 2010-10-17 MED ORDER — SODIUM CHLORIDE 0.9 % IV SOLN: Freq: Once | INTRAVENOUS | Status: DC

## 2010-10-17 MED ORDER — HYDROMORPHONE HCL 1 MG/ML IJ SOLN
1.0000 mg | Freq: Once | INTRAMUSCULAR | Status: AC
  Administered 2010-10-17: 1 mg via INTRAVENOUS
  Filled 2010-10-17: qty 1

## 2010-10-17 NOTE — ED Notes (Signed)
Pt states he is feeling better, pain has improved

## 2010-10-17 NOTE — ED Provider Notes (Signed)
History     CSN: [id number] Arrival date & time: 10/16/2010 10:44 PM   Chief Complaint  Patient presents with  . Influenza     (Include location/radiation/quality/duration/timing/severity/associated sxs/prior treatment) HPI Comments: Seen 73.  Patient is a 40 y.o. male presenting with flu symptoms. The history is provided by the patient.  Influenza This is a new (myalgias, runny nose, fever,  cough,nausea, no vomiting.) problem. The current episode started more than 2 days ago. The problem has not changed since onset.Associated symptoms include headaches. The symptoms are aggravated by nothing. The symptoms are relieved by nothing. He has tried acetaminophen for the symptoms. The treatment provided no relief.     Past Medical History  Diagnosis Date  . Irregular heartbeat   . Chronic back pain   . Chronic lumbar pain   . GERD (gastroesophageal reflux disease)   . Rectal polyp      Past Surgical History  Procedure Date  . Laceration repair     repair to right arm that severed artery    No family history on file.  History  Substance Use Topics  . Smoking status: Current Everyday Smoker    Types: Cigarettes  . Smokeless tobacco: Not on file  . Alcohol Use: No      Review of Systems  HENT: Positive for rhinorrhea and sneezing.   Respiratory: Positive for cough.   Genitourinary: Negative for testicular pain.  Musculoskeletal: Positive for myalgias.  Neurological: Positive for headaches.  All other systems reviewed and are negative.    Allergies  Toradol  Home Medications  No current outpatient prescriptions on file.  Physical Exam    BP 103/70  Pulse 64  Temp(Src) 98.7 F (37.1 C) (Oral)  Resp 20  Ht 5\' 9"  (1.753 m)  Wt 240 lb (108.863 kg)  BMI 35.44 kg/m2  SpO2 98%  Physical Exam  Nursing note and vitals reviewed. Constitutional: He is oriented to person, place, and time. He appears well-developed and well-nourished.  HENT:  Head:  Normocephalic and atraumatic.  Right Ear: External ear normal.  Left Ear: External ear normal.  Mouth/Throat: Oropharynx is clear and moist.  Eyes: EOM are normal.  Neck: Normal range of motion. Neck supple.  Cardiovascular: Normal rate, normal heart sounds and intact distal pulses.   Pulmonary/Chest: Effort normal. He has wheezes.  Abdominal: Soft.  Musculoskeletal: Normal range of motion.  Neurological: He is alert and oriented to person, place, and time.  Skin: Skin is warm and dry.    ED Course  Procedures  Results for orders placed during the hospital encounter of 09/17/10  CBC      Component Value Range   WBC 12.2 (*) 4.0 - 10.5 (K/uL)   RBC 4.73  4.22 - 5.81 (MIL/uL)   Hemoglobin 14.9  13.0 - 17.0 (g/dL)   HCT 40.9  81.1 - 91.4 (%)   MCV 90.3  78.0 - 100.0 (fL)   MCH 31.5  26.0 - 34.0 (pg)   MCHC 34.9  30.0 - 36.0 (g/dL)   RDW 78.2  95.6 - 21.3 (%)   Platelets 200  150 - 400 (K/uL)  DIFFERENTIAL      Component Value Range   Neutrophils Relative 65  43 - 77 (%)   Neutro Abs 8.0 (*) 1.7 - 7.7 (K/uL)   Lymphocytes Relative 25  12 - 46 (%)   Lymphs Abs 3.0  0.7 - 4.0 (K/uL)   Monocytes Relative 8  3 - 12 (%)   Monocytes Absolute  1.0  0.1 - 1.0 (K/uL)   Eosinophils Relative 1  0 - 5 (%)   Eosinophils Absolute 0.2  0.0 - 0.7 (K/uL)   Basophils Relative 0  0 - 1 (%)   Basophils Absolute 0.1  0.0 - 0.1 (K/uL)  BASIC METABOLIC PANEL      Component Value Range   Sodium 137  135 - 145 (mEq/L)   Potassium 3.4 (*) 3.5 - 5.1 (mEq/L)   Chloride 99  96 - 112 (mEq/L)   CO2 23  19 - 32 (mEq/L)   Glucose, Bld 112 (*) 70 - 99 (mg/dL)   BUN 22  6 - 23 (mg/dL)   Creatinine, Ser 1.61  0.50 - 1.35 (mg/dL)   Calcium 9.5  8.4 - 09.6 (mg/dL)   GFR calc non Af Amer >60  >60 (mL/min)   GFR calc Af Amer >60  >60 (mL/min)  URINALYSIS, ROUTINE W REFLEX MICROSCOPIC      Component Value Range   Color, Urine AMBER (*) YELLOW    Appearance CLEAR  CLEAR    Specific Gravity, Urine >1.030  (*) 1.005 - 1.030    pH 5.5  5.0 - 8.0    Glucose, UA NEGATIVE  NEGATIVE (mg/dL)   Hgb urine dipstick NEGATIVE  NEGATIVE    Bilirubin Urine NEGATIVE  NEGATIVE    Ketones, ur NEGATIVE  NEGATIVE (mg/dL)   Protein, ur NEGATIVE  NEGATIVE (mg/dL)   Urobilinogen, UA 0.2  0.0 - 1.0 (mg/dL)   Nitrite NEGATIVE  NEGATIVE    Leukocytes, UA NEGATIVE  NEGATIVE   POCT I-STAT TROPONIN I      Component Value Range   Troponin i, poc 0.01  0.00 - 0.08 (ng/mL)   Comment 3            Dg Chest 2 View  10/17/2010  *RADIOLOGY REPORT*  Clinical Data: Chest pain, shortness of breath, fever, history smoking  CHEST - 2 VIEW  Comparison: 09/17/2010  Findings: Normal heart size, mediastinal contours, and pulmonary vascularity. Mild peribronchial thickening. No pulmonary infiltrate, pleural effusion, or pneumothorax. Bones unremarkable.  IMPRESSION: Minimal bronchitic changes.  Original Report Authenticated By: Lollie Marrow, M.D.   Patient with flu like symptoms, runny nose, cough, headache, myalgias, fever. Received IVF 2 liters, took PO fluids. Labs with elevated wbc and left shift. Chest xray with no significant findings. Illness c/w viral syndrome. Patient / Family / Caregiver informed of clinical course, understand medical decision-making process, and agree with plan.Pt feels improved after observation and/or treatment in ED. MDM Reviewed: previous chart, nursing note and vitals Reviewed previous: labs and x-ray Interpretation: labs and x-ray Total time providing critical care: < 30 minutes. This excludes time spent performing separately reportable procedures and services.            Nicoletta Dress. Colon Branch, MD 10/17/10 [phone]

## 2010-10-27 ENCOUNTER — Emergency Department (HOSPITAL_COMMUNITY): Payer: Self-pay

## 2010-10-27 ENCOUNTER — Emergency Department (HOSPITAL_COMMUNITY)
Admission: EM | Admit: 2010-10-27 | Discharge: 2010-10-28 | Disposition: A | Discharge status: Home / Self Care | Payer: Self-pay | Attending: Emergency Medicine | Admitting: Emergency Medicine

## 2010-10-27 ENCOUNTER — Encounter (HOSPITAL_COMMUNITY): Payer: Self-pay

## 2010-10-27 ENCOUNTER — Other Ambulatory Visit: Payer: Self-pay

## 2010-10-27 DIAGNOSIS — R0602 Shortness of breath: Secondary | ICD-10-CM | POA: Insufficient documentation

## 2010-10-27 DIAGNOSIS — R062 Wheezing: Secondary | ICD-10-CM | POA: Insufficient documentation

## 2010-10-27 DIAGNOSIS — R079 Chest pain, unspecified: Secondary | ICD-10-CM | POA: Insufficient documentation

## 2010-10-27 DIAGNOSIS — F17200 Nicotine dependence, unspecified, uncomplicated: Secondary | ICD-10-CM | POA: Insufficient documentation

## 2010-10-27 LAB — BASIC METABOLIC PANEL
BUN: 18 mg/dL (ref 6–23)
CO2: 26 mEq/L (ref 19–32)
Calcium: 9.3 mg/dL (ref 8.4–10.5)
Chloride: 99 mEq/L (ref 96–112)
Creatinine, Ser: 0.94 mg/dL (ref 0.50–1.35)
Glucose, Bld: 85 mg/dL (ref 70–99)

## 2010-10-27 LAB — CBC
HCT: 44.3 % (ref 39.0–52.0)
MCH: 31.2 pg (ref 26.0–34.0)
MCV: 90.2 fL (ref 78.0–100.0)
Platelets: 69 10*3/uL — ABNORMAL LOW (ref 150–400)
RBC: 4.91 MIL/uL (ref 4.22–5.81)
WBC: 6.8 10*3/uL (ref 4.0–10.5)

## 2010-10-27 LAB — TROPONIN I: Troponin I: <0.3 ng/mL (ref <0.30)

## 2010-10-27 MED ORDER — IOHEXOL 350 MG/ML SOLN
100.0000 mL | Freq: Once | INTRAVENOUS | Status: AC | PRN
  Administered 2010-10-27: 100 mL via INTRAVENOUS

## 2010-10-27 MED ORDER — ALBUTEROL SULFATE HFA 108 (90 BASE) MCG/ACT IN AERS
2.0000 | INHALATION_SPRAY | Freq: Four times a day (QID) | RESPIRATORY_TRACT | Status: DC
  Administered 2010-10-27: 2 via RESPIRATORY_TRACT
  Filled 2010-10-27: qty 6.7

## 2010-10-27 MED ORDER — ALBUTEROL SULFATE (5 MG/ML) 0.5% IN NEBU
2.5000 mg | INHALATION_SOLUTION | Freq: Once | RESPIRATORY_TRACT | Status: AC
  Administered 2010-10-27: 2.5 mg via RESPIRATORY_TRACT
  Filled 2010-10-27: qty 0.5

## 2010-10-27 MED ORDER — HYDROCODONE-ACETAMINOPHEN 5-325 MG PO TABS: 1.0000 | ORAL_TABLET | ORAL | Status: AC | PRN

## 2010-10-27 MED ORDER — HYDROMORPHONE HCL 1 MG/ML IJ SOLN
1.0000 mg | Freq: Once | INTRAMUSCULAR | Status: AC
  Administered 2010-10-27: 1 mg via INTRAVENOUS
  Filled 2010-10-27: qty 1

## 2010-10-27 MED ORDER — ONDANSETRON HCL 4 MG/2ML IJ SOLN
4.0000 mg | Freq: Once | INTRAMUSCULAR | Status: AC
  Administered 2010-10-27: 4 mg via INTRAVENOUS
  Filled 2010-10-27: qty 2

## 2010-10-27 MED ORDER — SODIUM CHLORIDE 0.9 % IV BOLUS (SEPSIS)
250.0000 mL | Freq: Once | INTRAVENOUS | Status: AC
  Administered 2010-10-27: 250 mL via INTRAVENOUS

## 2010-10-27 MED ORDER — IPRATROPIUM BROMIDE 0.02 % IN SOLN
0.5000 mg | Freq: Once | RESPIRATORY_TRACT | Status: AC
  Administered 2010-10-27: 0.5 mg via RESPIRATORY_TRACT
  Filled 2010-10-27: qty 2.5

## 2010-10-27 MED ORDER — SODIUM CHLORIDE 0.9 % IV SOLN
INTRAVENOUS | Status: DC
  Administered 2010-10-27: 22:00:00 via INTRAVENOUS

## 2010-10-27 NOTE — ED Notes (Signed)
Pt c/o pain to chest and sob x 4 hours. Pt states he feels like some one has their hands around his neck choking him.

## 2010-10-27 NOTE — ED Provider Notes (Signed)
Ct showed no pe.  Pt to follow up with his md  Benny Lennert, MD 10/27/10 2348

## 2010-10-27 NOTE — ED Provider Notes (Signed)
History     CSN: [id number] Arrival date & time: 10/27/2010  5:50 PM  Chief Complaint  Patient presents with  . Chest Pain    HPI  (Consider location/radiation/quality/duration/timing/severity/associated sxs/prior treatment)  Patient is a 40 y.o. male presenting with chest pain.  Chest Pain The chest pain began 3 - 5 hours ago (AT 1500). Episode Length: CONSTANT. Chest pain occurs constantly. The chest pain is unchanged. Associated with: NOTHING. At its most intense, the pain is at 10/10. The pain is currently at 10/10. The quality of the pain is described as sharp and pressure-like. The pain radiates to the left arm. Primary symptoms include shortness of breath and wheezing. Pertinent negatives for primary symptoms include no fever, no cough, no palpitations, no abdominal pain, no nausea, no vomiting and no dizziness.  Pertinent negatives for associated symptoms include no near-syncope and no weakness. Risk factors include male gender.   SIMILAR PAIN IN MAY June WITH NEGAITVE CARDIAC CATH. NO PCM. SMOKES.   Past Medical History  Diagnosis Date  . Irregular heartbeat   . Chronic back pain   . Chronic lumbar pain   . GERD (gastroesophageal reflux disease)   . Rectal polyp     Past Surgical History  Procedure Date  . Laceration repair     repair to right arm that severed artery    No family history on file.  History  Substance Use Topics  . Smoking status: Current Everyday Smoker    Types: Cigarettes  . Smokeless tobacco: Not on file  . Alcohol Use: No      Review of Systems  Review of Systems  Constitutional: Negative for fever.  HENT: Negative for congestion.   Respiratory: Positive for shortness of breath and wheezing. Negative for cough.   Cardiovascular: Positive for chest pain. Negative for palpitations and near-syncope.  Gastrointestinal: Negative for nausea, vomiting, abdominal pain and diarrhea.  Genitourinary: Negative for dysuria.  Musculoskeletal:  Negative for back pain.  Neurological: Negative for dizziness, weakness and headaches.  Psychiatric/Behavioral: Negative for confusion.    Allergies  Toradol  Home Medications   Current Outpatient Rx  Name Route Sig Dispense Refill  . ACETAMINOPHEN 500 MG PO TABS Oral Take 500 mg by mouth every 6 (six) hours as needed. For pain       Physical Exam    BP 128/91  Pulse 78  Temp(Src) 98.6 F (37 C) (Oral)  Resp 20  Ht 5\' 9"  (1.753 m)  Wt 230 lb (104.327 kg)  BMI 33.96 kg/m2  SpO2 99%  Physical Exam  Nursing note and vitals reviewed. Constitutional: He is oriented to person, place, and time. He appears well-developed and well-nourished. He appears distressed.  HENT:  Head: Normocephalic and atraumatic.  Mouth/Throat: Oropharynx is clear and moist.  Eyes: Conjunctivae and EOM are normal. Pupils are equal, round, and reactive to light.  Neck: Normal range of motion. Neck supple.  Cardiovascular: Normal rate, regular rhythm and normal heart sounds.   No murmur heard. Pulmonary/Chest: Effort normal. He has wheezes. He has no rales.  Abdominal: Soft. Bowel sounds are normal. There is no tenderness.  Musculoskeletal: Normal range of motion. He exhibits no edema and no tenderness.  Neurological: He is alert and oriented to person, place, and time. No cranial nerve deficit. He exhibits normal muscle tone. Coordination normal.  Skin: Skin is warm. No rash noted. He is not diaphoretic.    ED Course  Procedures (including critical care time)   Labs Reviewed  CBC  BASIC METABOLIC PANEL  TROPONIN I   Dg Chest 2 View  10/17/2010  *RADIOLOGY REPORT*  Clinical Data: Chest pain, shortness of breath, fever, history smoking  CHEST - 2 VIEW  Comparison: 09/17/2010  Findings: Normal heart size, mediastinal contours, and pulmonary vascularity. Mild peribronchial thickening. No pulmonary infiltrate, pleural effusion, or pneumothorax. Bones unremarkable.  IMPRESSION: Minimal bronchitic  changes.  Original Report Authenticated By: Lollie Marrow, M.D.    Date: 10/27/2010  Rate: 87  Rhythm: normal sinus rhythm  QRS Axis: normal  Intervals: normal  ST/T Wave abnormalities: normal  Conduction Disutrbances:none  Narrative Interpretation:   Old EKG Reviewed: none available   MDM CHEST PAIN WORK UP NEGATIVE FOR ACUTE CARDAIC EVENT, PNUEMOTHORAX. CT ANGIO PENDING TO R/O PE. WHEEZING RESOLVED WITH NEBULIZER IN ED AND PAIN IMPROVED WIT DILAUDID. IF CT ANGIO NEG CAN GO HOME WIT PAIN MEDS AND INHALER.    IMP CHEST PAIN.      Shelda Jakes, MD 10/27/10 [phone]

## 2010-10-27 NOTE — ED Notes (Signed)
C/o sob and cp that started 2 hrs ago. States "feels like someone is squeezing me"

## 2010-10-27 NOTE — ED Notes (Signed)
Pt reporting pain in middle of chest.  Denies history of GERD or CHF.  States "it's not heartburn", although pt did belch several times during conversation.  Denies nausea or SOB.  VS stable, no distress noted.  Labs drawn.

## 2010-10-28 LAB — POCT I-STAT TROPONIN I

## 2011-03-10 ENCOUNTER — Emergency Department (HOSPITAL_COMMUNITY): Payer: Self-pay

## 2011-03-10 ENCOUNTER — Emergency Department (HOSPITAL_COMMUNITY)
Admission: EM | Admit: 2011-03-10 | Discharge: 2011-03-10 | Disposition: A | Discharge status: Home / Self Care | Payer: Self-pay | Attending: Emergency Medicine | Admitting: Emergency Medicine

## 2011-03-10 ENCOUNTER — Encounter (HOSPITAL_COMMUNITY): Payer: Self-pay | Admitting: *Deleted

## 2011-03-10 DIAGNOSIS — K219 Gastro-esophageal reflux disease without esophagitis: Secondary | ICD-10-CM | POA: Insufficient documentation

## 2011-03-10 DIAGNOSIS — S59909A Unspecified injury of unspecified elbow, initial encounter: Secondary | ICD-10-CM | POA: Insufficient documentation

## 2011-03-10 DIAGNOSIS — M545 Low back pain, unspecified: Secondary | ICD-10-CM | POA: Insufficient documentation

## 2011-03-10 DIAGNOSIS — S63501A Unspecified sprain of right wrist, initial encounter: Secondary | ICD-10-CM

## 2011-03-10 DIAGNOSIS — X500XXA Overexertion from strenuous movement or load, initial encounter: Secondary | ICD-10-CM | POA: Insufficient documentation

## 2011-03-10 DIAGNOSIS — S6990XA Unspecified injury of unspecified wrist, hand and finger(s), initial encounter: Secondary | ICD-10-CM | POA: Insufficient documentation

## 2011-03-10 DIAGNOSIS — I499 Cardiac arrhythmia, unspecified: Secondary | ICD-10-CM | POA: Insufficient documentation

## 2011-03-10 DIAGNOSIS — F17200 Nicotine dependence, unspecified, uncomplicated: Secondary | ICD-10-CM | POA: Insufficient documentation

## 2011-03-10 MED ORDER — IBUPROFEN 800 MG PO TABS
800.0000 mg | ORAL_TABLET | Freq: Once | ORAL | Status: AC
  Administered 2011-03-10: 800 mg via ORAL
  Filled 2011-03-10: qty 1

## 2011-03-10 MED ORDER — OXYCODONE-ACETAMINOPHEN 5-325 MG PO TABS: 1.0000 | ORAL_TABLET | ORAL | Status: AC | PRN

## 2011-03-10 MED ORDER — OXYCODONE-ACETAMINOPHEN 5-325 MG PO TABS
2.0000 | ORAL_TABLET | Freq: Once | ORAL | Status: AC
  Administered 2011-03-10: 2 via ORAL
  Filled 2011-03-10: qty 2

## 2011-03-10 NOTE — ED Provider Notes (Signed)
History     CSN: [id number]  Arrival date & time 03/10/11  1859   None     Chief Complaint  Patient presents with  . Wrist Pain    (Consider location/radiation/quality/duration/timing/severity/associated sxs/prior treatment) HPI Comments: Pt was operating a 2 man auger/posthole digger by himself.  The auger struck a root and twisted his R wrist severely and threw him to the ground.  Pt has had major R forearm reconstruction by dr. Dutch Quint ~ 2 years ago  Patient is a 41 y.o. male presenting with wrist pain. The history is provided by the patient and the spouse. No language interpreter was used.  Wrist Pain This is a new problem. Episode onset: just PTA. The problem has been unchanged. The symptoms are aggravated by twisting. He has tried nothing for the symptoms.    Past Medical History  Diagnosis Date  . Irregular heartbeat   . Chronic back pain   . Chronic lumbar pain   . GERD (gastroesophageal reflux disease)   . Rectal polyp     Past Surgical History  Procedure Date  . Laceration repair     repair to right arm that severed artery    No family history on file.  History  Substance Use Topics  . Smoking status: Current Everyday Smoker    Types: Cigarettes  . Smokeless tobacco: Not on file  . Alcohol Use: No      Review of Systems  Musculoskeletal:       Wrist injury  All other systems reviewed and are negative.    Allergies  Toradol  Home Medications   Current Outpatient Rx  Name Route Sig Dispense Refill  . ACETAMINOPHEN 500 MG PO TABS Oral Take 500 mg by mouth every 6 (six) hours as needed. For pain       BP 121/86  Pulse 90  Temp(Src) 98.6 F (37 C) (Oral)  Resp 22  Ht 5\' 9"  (1.753 m)  Wt 240 lb (108.863 kg)  BMI 35.44 kg/m2  SpO2 98%  Physical Exam  Nursing note and vitals reviewed. Constitutional: He is oriented to person, place, and time. He appears well-developed and well-nourished. No distress.  HENT:  Head: Normocephalic and  atraumatic.  Eyes: EOM are normal.  Neck: Normal range of motion.  Cardiovascular: Normal rate, regular rhythm, normal heart sounds and intact distal pulses.   Pulmonary/Chest: Effort normal and breath sounds normal. No respiratory distress.  Abdominal: Soft. He exhibits no distension. There is no tenderness.  Musculoskeletal: He exhibits tenderness.       Right wrist: He exhibits decreased range of motion, tenderness, bony tenderness and swelling.  Neurological: He is alert and oriented to person, place, and time.  Skin: Skin is warm and dry.  Psychiatric: He has a normal mood and affect. Judgment normal.    ED Course  Procedures (including critical care time)  Labs Reviewed - No data to display Dg Wrist Complete Right  03/10/2011  *RADIOLOGY REPORT*  Clinical Data: Pain post twisted wrist  RIGHT WRIST - COMPLETE 3+ VIEW  Comparison: 06/07/2006  Findings: Four views of the right wrist submitted.  There is no acute fracture or subluxation.  No radiopaque foreign body is identified.  IMPRESSION: No acute fracture or subluxation.  Original Report Authenticated By: Natasha Mead, M.D.     No diagnosis found.    MDM          Worthy Rancher, PA 03/10/11 2126

## 2011-03-10 NOTE — ED Notes (Signed)
Pt reports he was working on a fence post and twisted his rt wrist, pt c/o pain to rt wrist and upper arm, pt reports previous surgery to same arm, PMS intact

## 2011-03-10 NOTE — ED Notes (Signed)
Pt states was running a two man auger by himself, when the auger "kicked back causing injury to his arm. Rt wrist is swollen, and tender to touch.

## 2011-03-11 NOTE — ED Provider Notes (Signed)
Medical screening examination/treatment/procedure(s) were performed by non-physician practitioner and as supervising physician I was immediately available for consultation/collaboration.  Nicoletta Dress. Colon Branch, MD 03/11/11 [phone]

## 2011-08-21 ENCOUNTER — Other Ambulatory Visit: Payer: Self-pay

## 2011-08-21 ENCOUNTER — Encounter (HOSPITAL_COMMUNITY): Payer: Self-pay | Admitting: *Deleted

## 2011-08-21 ENCOUNTER — Emergency Department (HOSPITAL_COMMUNITY)
Admission: EM | Admit: 2011-08-21 | Discharge: 2011-08-21 | Disposition: A | Discharge status: Home / Self Care | Payer: No Typology Code available for payment source | Attending: Emergency Medicine | Admitting: Emergency Medicine

## 2011-08-21 DIAGNOSIS — M549 Dorsalgia, unspecified: Secondary | ICD-10-CM | POA: Diagnosis not present

## 2011-08-21 DIAGNOSIS — F17200 Nicotine dependence, unspecified, uncomplicated: Secondary | ICD-10-CM | POA: Diagnosis not present

## 2011-08-21 DIAGNOSIS — R079 Chest pain, unspecified: Secondary | ICD-10-CM | POA: Insufficient documentation

## 2011-08-21 DIAGNOSIS — M542 Cervicalgia: Secondary | ICD-10-CM | POA: Insufficient documentation

## 2011-08-21 DIAGNOSIS — K219 Gastro-esophageal reflux disease without esophagitis: Secondary | ICD-10-CM | POA: Insufficient documentation

## 2011-08-21 DIAGNOSIS — R0602 Shortness of breath: Secondary | ICD-10-CM | POA: Diagnosis not present

## 2011-08-21 MED ORDER — OXYCODONE-ACETAMINOPHEN 5-325 MG PO TABS
2.0000 | ORAL_TABLET | Freq: Once | ORAL | Status: AC
  Administered 2011-08-21: 2 via ORAL
  Filled 2011-08-21: qty 2

## 2011-08-21 MED ORDER — OXYCODONE-ACETAMINOPHEN 5-325 MG PO TABS: 1.0000 | ORAL_TABLET | ORAL | Status: AC | PRN
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
## 2011-10-14 IMAGING — CR DG CHEST 2V
2 series · 2 of 2 positions shown · non-contrast
Comparison: Chest radiograph performed at 15,012

CLINICAL DATA: Mid chest pain; history of arrhythmia.

CHEST - 2 VIEW

[view not recorded (1 of 2)]
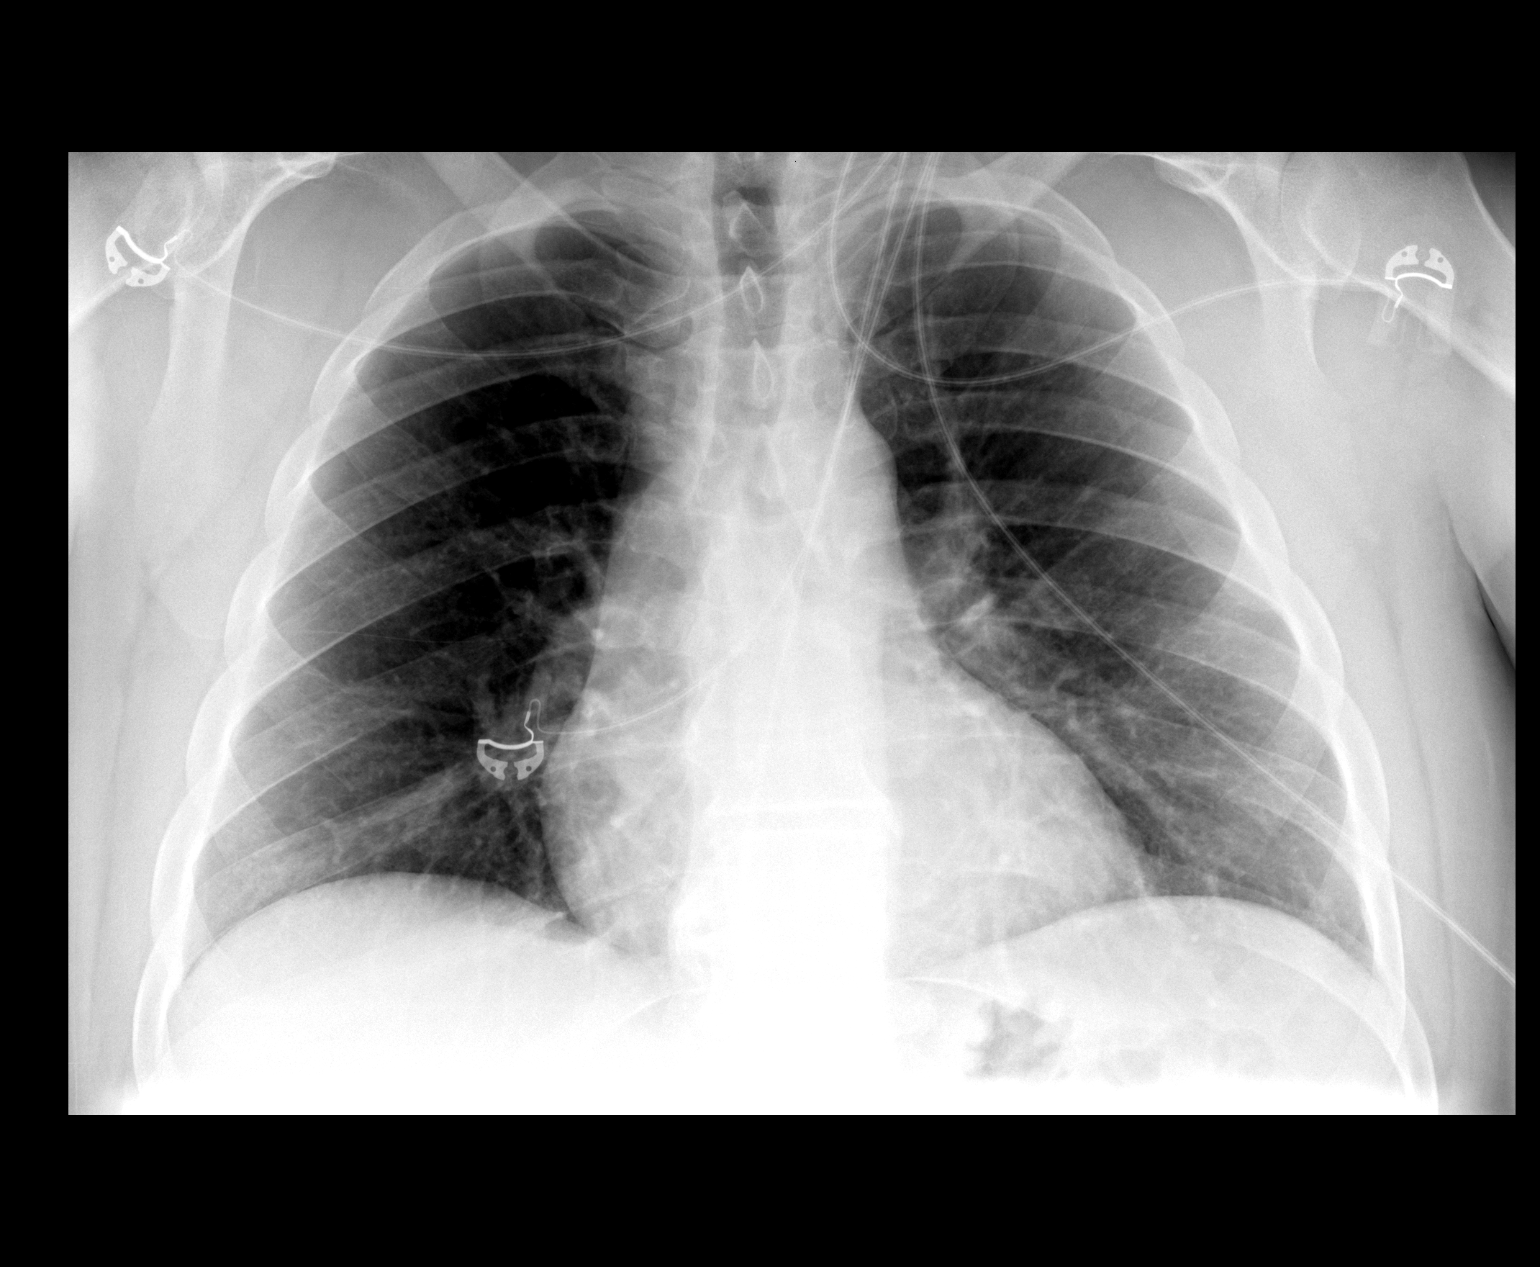

[view not recorded (2 of 2)]
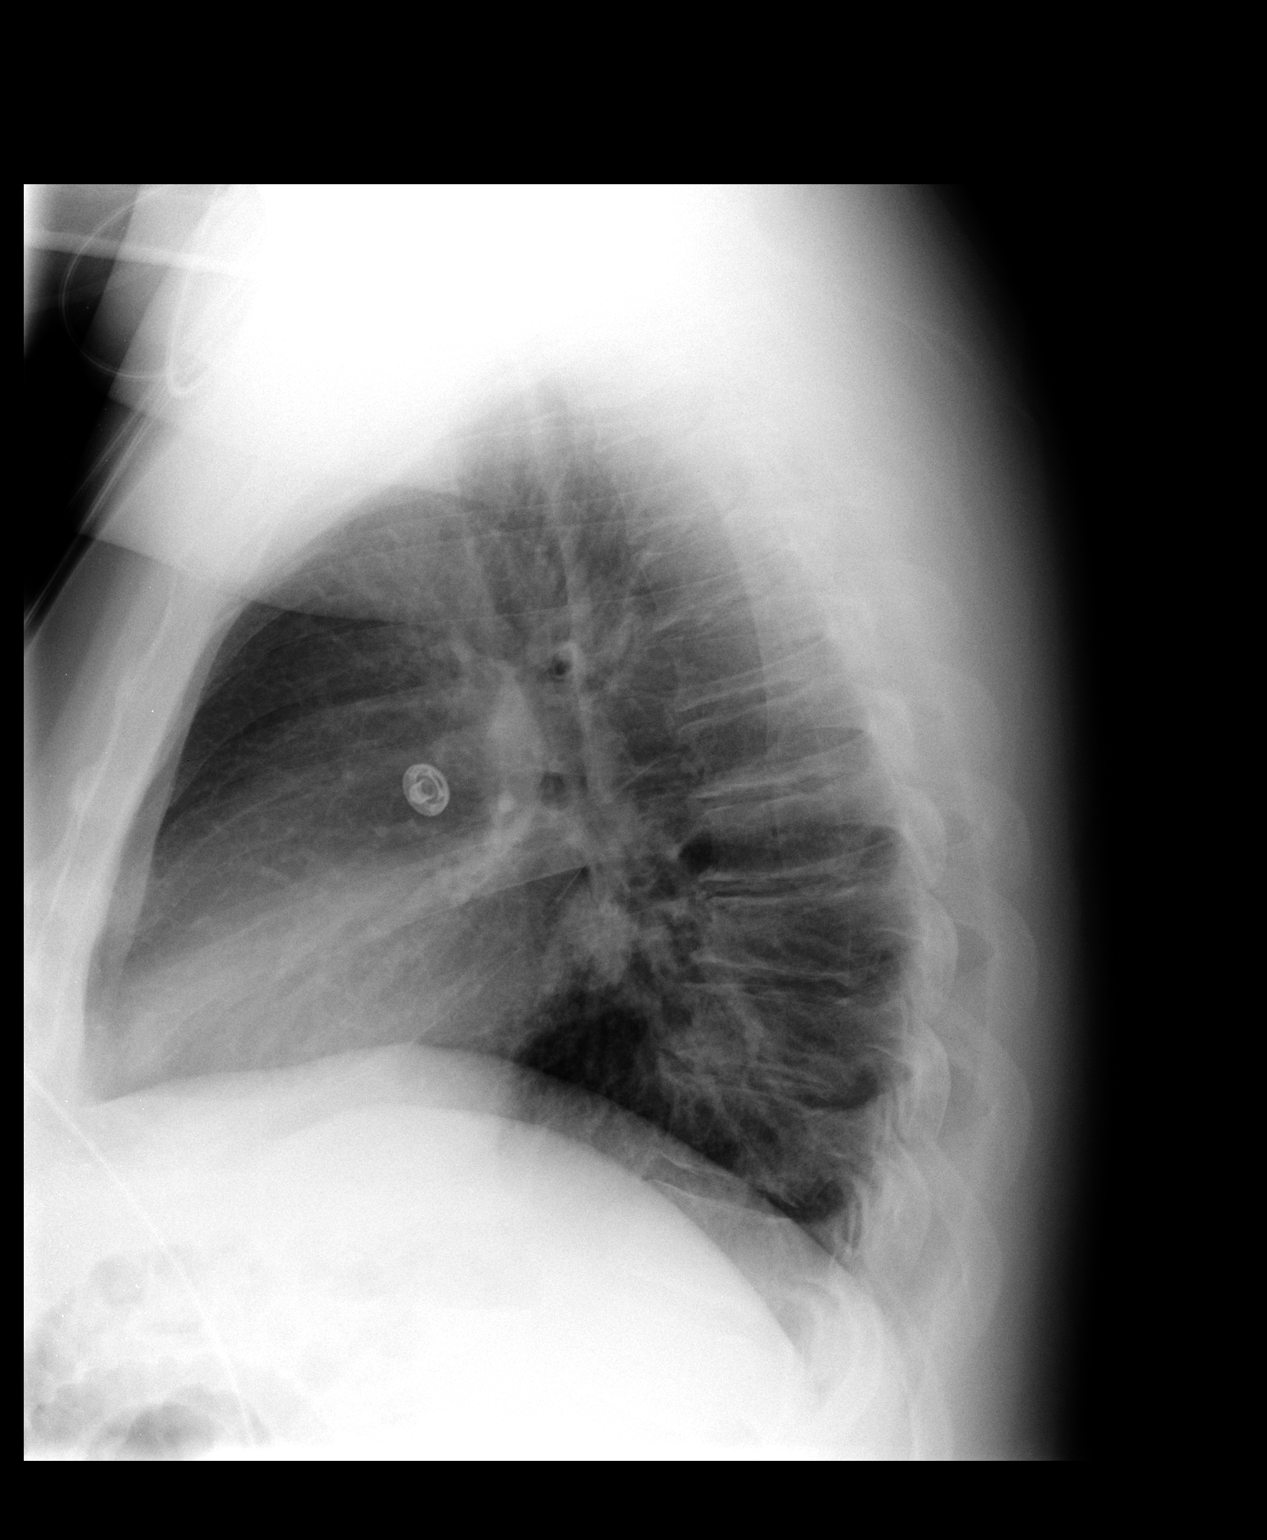

[2 of 2 positions shown; findings below may reference images not displayed]

FINDINGS: The lungs are well-aerated and clear.  There is no
evidence of focal opacification, pleural effusion or pneumothorax.

The heart is normal in size; the mediastinal contour is within
normal limits. Rounded density inferior to the bronchus shadow on
the lateral view likely reflects normal vasculature.  No acute
osseous abnormalities are seen.
IMPRESSION: No acute cardiopulmonary process seen.

## 2012-03-18 ENCOUNTER — Emergency Department (HOSPITAL_COMMUNITY)
Admission: EM | Admit: 2012-03-18 | Discharge: 2012-03-18 | Disposition: A | Discharge status: Home / Self Care | Payer: Self-pay | Attending: Emergency Medicine | Admitting: Emergency Medicine

## 2012-03-18 ENCOUNTER — Emergency Department (HOSPITAL_COMMUNITY): Payer: Self-pay

## 2012-03-18 ENCOUNTER — Encounter (HOSPITAL_COMMUNITY): Payer: Self-pay | Admitting: *Deleted

## 2012-03-18 DIAGNOSIS — F17200 Nicotine dependence, unspecified, uncomplicated: Secondary | ICD-10-CM | POA: Insufficient documentation

## 2012-03-18 DIAGNOSIS — Z860100 Personal history of colon polyps, unspecified: Secondary | ICD-10-CM | POA: Insufficient documentation

## 2012-03-18 DIAGNOSIS — R002 Palpitations: Secondary | ICD-10-CM | POA: Insufficient documentation

## 2012-03-18 DIAGNOSIS — Y939 Activity, unspecified: Secondary | ICD-10-CM | POA: Insufficient documentation

## 2012-03-18 DIAGNOSIS — Z8719 Personal history of other diseases of the digestive system: Secondary | ICD-10-CM | POA: Insufficient documentation

## 2012-03-18 DIAGNOSIS — M549 Dorsalgia, unspecified: Secondary | ICD-10-CM | POA: Insufficient documentation

## 2012-03-18 DIAGNOSIS — W010XXA Fall on same level from slipping, tripping and stumbling without subsequent striking against object, initial encounter: Secondary | ICD-10-CM | POA: Insufficient documentation

## 2012-03-18 DIAGNOSIS — Y9289 Other specified places as the place of occurrence of the external cause: Secondary | ICD-10-CM | POA: Insufficient documentation

## 2012-03-18 DIAGNOSIS — S5010XA Contusion of unspecified forearm, initial encounter: Secondary | ICD-10-CM | POA: Insufficient documentation

## 2012-03-18 DIAGNOSIS — Z8679 Personal history of other diseases of the circulatory system: Secondary | ICD-10-CM | POA: Insufficient documentation

## 2012-03-18 DIAGNOSIS — S5011XA Contusion of right forearm, initial encounter: Secondary | ICD-10-CM

## 2012-03-18 DIAGNOSIS — G8929 Other chronic pain: Secondary | ICD-10-CM | POA: Insufficient documentation

## 2012-03-18 MED ORDER — HYDROCODONE-ACETAMINOPHEN 5-325 MG PO TABS
2.0000 | ORAL_TABLET | Freq: Once | ORAL | Status: AC
  Administered 2012-03-18: 2 via ORAL
  Filled 2012-03-18: qty 2

## 2012-03-18 MED ORDER — PROMETHAZINE HCL 12.5 MG PO TABS
12.5000 mg | ORAL_TABLET | Freq: Once | ORAL | Status: AC
  Administered 2012-03-18: 12.5 mg via ORAL
  Filled 2012-03-18: qty 1

## 2012-03-18 MED ORDER — IBUPROFEN 800 MG PO TABS
800.0000 mg | ORAL_TABLET | Freq: Once | ORAL | Status: DC
  Filled 2012-03-18: qty 1

## 2012-03-18 MED ORDER — HYDROCODONE-ACETAMINOPHEN 5-325 MG PO TABS: ORAL_TABLET | ORAL | Status: DC

## 2012-03-18 NOTE — ED Notes (Signed)
Pt slipped and fell on stairs this morning hurting his inner part of his right arm where he has previously had surgery (old scar). Pt c/o no relief with tylenol or other otc drugs.

## 2012-03-18 NOTE — ED Notes (Signed)
Ot refused Ibuprofen, states he has an allergy to the med.

## 2012-03-18 NOTE — ED Provider Notes (Signed)
History     CSN: [id number]  Arrival date & time 03/18/12  2440   First MD Initiated Contact with Patient 03/18/12 2025      Chief Complaint  Patient presents with  . Arm Pain    (Consider location/radiation/quality/duration/timing/severity/associated sxs/prior treatment) Patient is a 42 y.o. male presenting with arm pain. The history is provided by the patient.  Arm Pain This is a new problem. The current episode started today. The problem occurs constantly. The problem has been gradually worsening. Associated symptoms include arthralgias. Pertinent negatives include no abdominal pain, chest pain, coughing, nausea, neck pain, numbness or vomiting. Exacerbated by: MOVEMENT OR PALPATION. He has tried acetaminophen for the symptoms. The treatment provided no relief.    Past Medical History  Diagnosis Date  . Irregular heartbeat   . Chronic back pain   . Chronic lumbar pain   . GERD (gastroesophageal reflux disease)   . Rectal polyp     Past Surgical History  Procedure Laterality Date  . Laceration repair      repair to right arm that severed artery    History reviewed. No pertinent family history.  History  Substance Use Topics  . Smoking status: Current Every Day Smoker    Types: Cigarettes  . Smokeless tobacco: Not on file  . Alcohol Use: No      Review of Systems  Constitutional: Negative for activity change.       All ROS Neg except as noted in HPI  HENT: Negative for nosebleeds and neck pain.   Eyes: Negative for photophobia and discharge.  Respiratory: Negative for cough, shortness of breath and wheezing.   Cardiovascular: Positive for palpitations. Negative for chest pain.  Gastrointestinal: Negative for nausea, vomiting, abdominal pain and blood in stool.  Genitourinary: Negative for dysuria, frequency and hematuria.  Musculoskeletal: Positive for back pain and arthralgias.  Skin: Negative.   Neurological: Negative for dizziness, seizures, speech  difficulty and numbness.  Psychiatric/Behavioral: Negative for hallucinations and confusion.    Allergies  Ibuprofen and Ketorolac tromethamine  Home Medications   Current Outpatient Rx  Name  Route  Sig  Dispense  Refill  . acetaminophen (TYLENOL) 500 MG tablet   Oral   Take 500 mg by mouth every 6 (six) hours as needed. For pain            BP 127/86  Pulse 84  Temp(Src) 97.9 F (36.6 C)  Resp 20  Ht 5\' 9"  (1.753 m)  Wt 240 lb (108.863 kg)  BMI 35.43 kg/m2  SpO2 97%  Physical Exam  Nursing note and vitals reviewed. Constitutional: He is oriented to person, place, and time. He appears well-developed and well-nourished.  Non-toxic appearance.  HENT:  Head: Normocephalic.  Right Ear: Tympanic membrane and external ear normal.  Left Ear: Tympanic membrane and external ear normal.  Eyes: EOM and lids are normal. Pupils are equal, round, and reactive to light.  Neck: Normal range of motion. Neck supple. Carotid bruit is not present.  Cardiovascular: Normal rate, regular rhythm, normal heart sounds, intact distal pulses and normal pulses.   Pulmonary/Chest: Breath sounds normal. No respiratory distress.  Abdominal: Soft. Bowel sounds are normal. There is no tenderness. There is no guarding.  Musculoskeletal: Normal range of motion.  There is a well-healed surgical scar on the inner aspect of the right forearm. There is full range of motion of the right shoulder and elbow. There is pain and soreness to palpation of the right forearm. There is full  range of motion of the right wrist and fingers. The capillary refill is less than 3 seconds. Sensory is intact.  Lymphadenopathy:       Head (right side): No submandibular adenopathy present.       Head (left side): No submandibular adenopathy present.    He has no cervical adenopathy.  Neurological: He is alert and oriented to person, place, and time. He has normal strength. No cranial nerve deficit or sensory deficit.  Skin: Skin  is warm and dry.  Psychiatric: He has a normal mood and affect. His speech is normal.    ED Course  Procedures (including critical care time)  Labs Reviewed - No data to display Dg Elbow Complete Right  03/18/2012  *RADIOLOGY REPORT*  Clinical Data: Larey Seat, pain  RIGHT ELBOW - COMPLETE 3+ VIEW  Comparison: None.  Findings: There is no evidence of fracture or dislocation.  There is no evidence of arthropathy or other focal bony abnormality. Soft tissues are unremarkable. Small metallic clips noted anteriorly and medially in the soft tissues.  IMPRESSION: No acute osseous findings.   Original Report Authenticated By: Davonna Belling, M.D.    Dg Forearm Right  03/18/2012  *RADIOLOGY REPORT*  Clinical Data: Larey Seat, pain  RIGHT FOREARM - 2 VIEW  Comparison: None.  Findings: There is no evidence of fracture or dislocation.  There is no evidence of arthropathy or other focal bony abnormality. Soft tissues are unremarkable.  IMPRESSION: No fractures seen.   Original Report Authenticated By: Davonna Belling, M.D.      No diagnosis found.    MDM  I have reviewed nursing notes, vital signs, and all appropriate lab and imaging results for this patient. X-ray of the right elbow is negative for fracture or dislocation, there are noted some metal clips from previous operative procedure. The x-ray of the right forearm is negative for fracture or dislocations. The patient is fitted with a sling. Prescription for Norco one every 4 hours #20 given to the patient.       Kathie Dike, [place] 03/18/12 2059

## 2012-03-19 ENCOUNTER — Telehealth (HOSPITAL_COMMUNITY): Payer: Self-pay | Admitting: Emergency Medicine

## 2012-03-19 NOTE — ED Provider Notes (Signed)
Medical screening examination/treatment/procedure(s) were performed by non-physician practitioner and as supervising physician I was immediately available for consultation/collaboration.   Dione Booze, MD 03/19/12 [phone]

## 2012-04-17 ENCOUNTER — Emergency Department (HOSPITAL_COMMUNITY)
Admission: EM | Admit: 2012-04-17 | Discharge: 2012-04-17 | Disposition: A | Discharge status: Home / Self Care | Payer: Self-pay | Attending: Emergency Medicine | Admitting: Emergency Medicine

## 2012-04-17 ENCOUNTER — Encounter (HOSPITAL_COMMUNITY): Payer: Self-pay | Admitting: *Deleted

## 2012-04-17 ENCOUNTER — Emergency Department (HOSPITAL_COMMUNITY): Payer: Self-pay

## 2012-04-17 DIAGNOSIS — R059 Cough, unspecified: Secondary | ICD-10-CM | POA: Insufficient documentation

## 2012-04-17 DIAGNOSIS — F17200 Nicotine dependence, unspecified, uncomplicated: Secondary | ICD-10-CM | POA: Insufficient documentation

## 2012-04-17 DIAGNOSIS — Y9289 Other specified places as the place of occurrence of the external cause: Secondary | ICD-10-CM | POA: Insufficient documentation

## 2012-04-17 DIAGNOSIS — S6991XA Unspecified injury of right wrist, hand and finger(s), initial encounter: Secondary | ICD-10-CM

## 2012-04-17 DIAGNOSIS — M545 Low back pain, unspecified: Secondary | ICD-10-CM | POA: Insufficient documentation

## 2012-04-17 DIAGNOSIS — H539 Unspecified visual disturbance: Secondary | ICD-10-CM | POA: Insufficient documentation

## 2012-04-17 DIAGNOSIS — R079 Chest pain, unspecified: Secondary | ICD-10-CM

## 2012-04-17 DIAGNOSIS — J3489 Other specified disorders of nose and nasal sinuses: Secondary | ICD-10-CM | POA: Insufficient documentation

## 2012-04-17 DIAGNOSIS — J45909 Unspecified asthma, uncomplicated: Secondary | ICD-10-CM | POA: Insufficient documentation

## 2012-04-17 DIAGNOSIS — K921 Melena: Secondary | ICD-10-CM | POA: Insufficient documentation

## 2012-04-17 DIAGNOSIS — Z8679 Personal history of other diseases of the circulatory system: Secondary | ICD-10-CM | POA: Insufficient documentation

## 2012-04-17 DIAGNOSIS — Y9389 Activity, other specified: Secondary | ICD-10-CM | POA: Insufficient documentation

## 2012-04-17 DIAGNOSIS — R072 Precordial pain: Secondary | ICD-10-CM | POA: Insufficient documentation

## 2012-04-17 DIAGNOSIS — W19XXXA Unspecified fall, initial encounter: Secondary | ICD-10-CM

## 2012-04-17 DIAGNOSIS — S59909A Unspecified injury of unspecified elbow, initial encounter: Secondary | ICD-10-CM | POA: Insufficient documentation

## 2012-04-17 DIAGNOSIS — W108XXA Fall (on) (from) other stairs and steps, initial encounter: Secondary | ICD-10-CM | POA: Insufficient documentation

## 2012-04-17 DIAGNOSIS — R0602 Shortness of breath: Secondary | ICD-10-CM | POA: Insufficient documentation

## 2012-04-17 DIAGNOSIS — S6990XA Unspecified injury of unspecified wrist, hand and finger(s), initial encounter: Secondary | ICD-10-CM | POA: Insufficient documentation

## 2012-04-17 DIAGNOSIS — Z8719 Personal history of other diseases of the digestive system: Secondary | ICD-10-CM | POA: Insufficient documentation

## 2012-04-17 DIAGNOSIS — G8929 Other chronic pain: Secondary | ICD-10-CM | POA: Insufficient documentation

## 2012-04-17 LAB — CBC WITH DIFFERENTIAL/PLATELET
Eosinophils Relative: 1 % (ref 0–5)
HCT: 44.9 % (ref 39.0–52.0)
Lymphocytes Relative: 28 % (ref 12–46)
Lymphs Abs: 2.6 10*3/uL (ref 0.7–4.0)
MCH: 31.4 pg (ref 26.0–34.0)
MCV: 89.8 fL (ref 78.0–100.0)
Monocytes Absolute: 0.5 10*3/uL (ref 0.1–1.0)
RBC: 5 MIL/uL (ref 4.22–5.81)
WBC: 9.5 10*3/uL (ref 4.0–10.5)

## 2012-04-17 LAB — BASIC METABOLIC PANEL
BUN: 18 mg/dL (ref 6–23)
CO2: 27 mEq/L (ref 19–32)
Calcium: 9.5 mg/dL (ref 8.4–10.5)
Creatinine, Ser: 0.9 mg/dL (ref 0.50–1.35)
Glucose, Bld: 118 mg/dL — ABNORMAL HIGH (ref 70–99)

## 2012-04-17 MED ORDER — ALBUTEROL SULFATE (5 MG/ML) 0.5% IN NEBU
2.5000 mg | INHALATION_SOLUTION | Freq: Once | RESPIRATORY_TRACT | Status: AC
  Administered 2012-04-17: 2.5 mg via RESPIRATORY_TRACT
  Filled 2012-04-17: qty 0.5

## 2012-04-17 MED ORDER — SODIUM CHLORIDE 0.9 % IV SOLN: INTRAVENOUS | Status: DC

## 2012-04-17 MED ORDER — HYDROMORPHONE HCL PF 1 MG/ML IJ SOLN
1.0000 mg | Freq: Once | INTRAMUSCULAR | Status: AC
  Administered 2012-04-17: 1 mg via INTRAVENOUS
  Filled 2012-04-17: qty 1

## 2012-04-17 MED ORDER — SODIUM CHLORIDE 0.9 % IV BOLUS (SEPSIS)
250.0000 mL | Freq: Once | INTRAVENOUS | Status: AC
  Administered 2012-04-17: 250 mL via INTRAVENOUS

## 2012-04-17 MED ORDER — ONDANSETRON HCL 4 MG/2ML IJ SOLN
4.0000 mg | Freq: Once | INTRAMUSCULAR | Status: AC
  Administered 2012-04-17: 4 mg via INTRAVENOUS
  Filled 2012-04-17: qty 2

## 2012-04-17 MED ORDER — ASPIRIN 81 MG PO CHEW
324.0000 mg | CHEWABLE_TABLET | Freq: Once | ORAL | Status: AC
  Administered 2012-04-17: 324 mg via ORAL
  Filled 2012-04-17: qty 4

## 2012-04-17 MED ORDER — HYDROCODONE-ACETAMINOPHEN 5-325 MG PO TABS: 1.0000 | ORAL_TABLET | Freq: Four times a day (QID) | ORAL | Status: DC | PRN

## 2012-04-17 MED ORDER — IPRATROPIUM BROMIDE 0.02 % IN SOLN
0.5000 mg | Freq: Once | RESPIRATORY_TRACT | Status: AC
  Administered 2012-04-17: 0.5 mg via RESPIRATORY_TRACT
  Filled 2012-04-17: qty 2.5

## 2012-04-17 MED ORDER — ALBUTEROL SULFATE HFA 108 (90 BASE) MCG/ACT IN AERS
2.0000 | INHALATION_SPRAY | Freq: Four times a day (QID) | RESPIRATORY_TRACT | Status: DC
  Administered 2012-04-17: 2 via RESPIRATORY_TRACT
  Filled 2012-04-17: qty 6.7

## 2012-04-17 NOTE — ED Notes (Signed)
RT notified of inhaler and teaching needed prior to discharge.

## 2012-04-17 NOTE — ED Notes (Signed)
Patient would like to wait to put the sling on at discharge.

## 2012-04-17 NOTE — ED Notes (Addendum)
Chest pain for 2-3 days, worse for 6 hours. Fell down 4-5 steps,due to ice. Pain rt arm after fall Feels sob  No HI or LOC
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
## 2012-04-17 NOTE — ED Notes (Signed)
MD at bedside. 

## 2012-05-14 ENCOUNTER — Emergency Department (HOSPITAL_COMMUNITY)
Admission: EM | Admit: 2012-05-14 | Discharge: 2012-05-14 | Disposition: A | Discharge status: Home / Self Care | Payer: Self-pay | Attending: Emergency Medicine | Admitting: Emergency Medicine

## 2012-05-14 ENCOUNTER — Encounter (HOSPITAL_COMMUNITY): Payer: Self-pay | Admitting: *Deleted

## 2012-05-14 DIAGNOSIS — Y9389 Activity, other specified: Secondary | ICD-10-CM | POA: Insufficient documentation

## 2012-05-14 DIAGNOSIS — Y9289 Other specified places as the place of occurrence of the external cause: Secondary | ICD-10-CM | POA: Insufficient documentation

## 2012-05-14 DIAGNOSIS — R002 Palpitations: Secondary | ICD-10-CM | POA: Insufficient documentation

## 2012-05-14 DIAGNOSIS — Z8719 Personal history of other diseases of the digestive system: Secondary | ICD-10-CM | POA: Insufficient documentation

## 2012-05-14 DIAGNOSIS — M545 Low back pain, unspecified: Secondary | ICD-10-CM

## 2012-05-14 DIAGNOSIS — G8929 Other chronic pain: Secondary | ICD-10-CM | POA: Insufficient documentation

## 2012-05-14 DIAGNOSIS — Z860100 Personal history of colon polyps, unspecified: Secondary | ICD-10-CM | POA: Insufficient documentation

## 2012-05-14 DIAGNOSIS — R296 Repeated falls: Secondary | ICD-10-CM | POA: Insufficient documentation

## 2012-05-14 DIAGNOSIS — IMO0002 Reserved for concepts with insufficient information to code with codable children: Secondary | ICD-10-CM | POA: Insufficient documentation

## 2012-05-14 DIAGNOSIS — F17200 Nicotine dependence, unspecified, uncomplicated: Secondary | ICD-10-CM | POA: Insufficient documentation

## 2012-05-14 MED ORDER — DEXAMETHASONE 6 MG PO TABS: ORAL_TABLET | ORAL | Status: DC

## 2012-05-14 MED ORDER — DIAZEPAM 5 MG PO TABS
5.0000 mg | ORAL_TABLET | Freq: Once | ORAL | Status: AC
  Administered 2012-05-14: 5 mg via ORAL
  Filled 2012-05-14: qty 1

## 2012-05-14 MED ORDER — MORPHINE SULFATE 4 MG/ML IJ SOLN
8.0000 mg | Freq: Once | INTRAMUSCULAR | Status: AC
  Administered 2012-05-14: 8 mg via INTRAMUSCULAR
  Filled 2012-05-14: qty 2

## 2012-05-14 MED ORDER — DEXAMETHASONE SODIUM PHOSPHATE 4 MG/ML IJ SOLN
8.0000 mg | Freq: Once | INTRAMUSCULAR | Status: AC
  Administered 2012-05-14: 8 mg via INTRAMUSCULAR
  Filled 2012-05-14: qty 2

## 2012-05-14 MED ORDER — ONDANSETRON HCL 4 MG PO TABS
4.0000 mg | ORAL_TABLET | Freq: Once | ORAL | Status: AC
  Administered 2012-05-14: 4 mg via ORAL
  Filled 2012-05-14: qty 1

## 2012-05-14 MED ORDER — ACETAMINOPHEN-CODEINE #3 300-30 MG PO TABS: 1.0000 | ORAL_TABLET | Freq: Four times a day (QID) | ORAL | Status: DC | PRN

## 2012-05-14 MED ORDER — BACLOFEN 10 MG PO TABS: 10.0000 mg | ORAL_TABLET | Freq: Three times a day (TID) | ORAL | Status: AC

## 2012-05-14 NOTE — ED Notes (Addendum)
Pt was working on placing fence post yesterday, lost his balance causing him to fall and rolled around on the ground, pt c/o lower back pain that radiates down right leg, no problems with bowel or bladder function,

## 2012-05-14 NOTE — ED Notes (Signed)
Pt states he fell backwards yesterday while putting up a fence post. Pain to lower back radiating down right leg. NAD.

## 2012-05-14 NOTE — ED Notes (Signed)
Hobson PA at bedside,  

## 2012-05-14 NOTE — ED Provider Notes (Signed)
Medical screening examination/treatment/procedure(s) were performed by non-physician practitioner and as supervising physician I was immediately available for consultation/collaboration.   Joya Gaskins, MD 05/14/12 1049

## 2012-05-14 NOTE — ED Provider Notes (Signed)
History     CSN: [id number]  Arrival date & time 05/14/12  7829   First MD Initiated Contact with Patient 05/14/12 0901      Chief Complaint  Patient presents with  . Back Pain    (Consider location/radiation/quality/duration/timing/severity/associated sxs/prior treatment) Patient is a 42 y.o. male presenting with back pain. The history is provided by the patient.  Back Pain Location:  Lumbar spine Quality:  Aching and shooting Radiates to:  R knee Pain severity:  Moderate Pain is:  Same all the time Duration:  1 day Timing:  Constant Progression:  Worsening Chronicity: new on chronic. Context comment:  Larey Seat while putting up a fence post. Relieved by:  Nothing Worsened by:  Standing, touching and twisting Ineffective treatments:  None tried Associated symptoms: no abdominal pain, no bladder incontinence, no bowel incontinence, no chest pain, no dysuria and no perianal numbness     Past Medical History  Diagnosis Date  . Irregular heartbeat   . Chronic back pain   . Chronic lumbar pain   . GERD (gastroesophageal reflux disease)   . Rectal polyp     Past Surgical History  Procedure Laterality Date  . 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  History  Substance Use Topics  . Smoking status: Current Every Day Smoker    Types: Cigarettes  . Smokeless tobacco: Not on file  . Alcohol Use: No      Review of Systems  Constitutional: Negative for activity change.       All ROS Neg except as noted in HPI  HENT: Negative for nosebleeds and neck pain.   Eyes: Negative for photophobia and discharge.  Respiratory: Negative for cough, shortness of breath and wheezing.   Cardiovascular: Positive for palpitations. Negative for chest pain.  Gastrointestinal: Negative for abdominal pain, blood in stool and bowel incontinence.  Genitourinary: Negative for bladder incontinence, dysuria, frequency and hematuria.   Musculoskeletal: Positive for back pain. Negative for arthralgias.  Skin: Negative.   Neurological: Negative for dizziness, seizures and speech difficulty.  Psychiatric/Behavioral: Negative for hallucinations and confusion.    Allergies  Ibuprofen and Ketorolac tromethamine  Home Medications   Current Outpatient Rx  Name  Route  Sig  Dispense  Refill  . acetaminophen (TYLENOL) 500 MG tablet   Oral   Take 1,000 mg by mouth every 6 (six) hours as needed. For pain           BP 156/93  Pulse 82  Temp(Src) 97.8 F (36.6 C) (Oral)  Resp 16  Ht 5\' 9"  (1.753 m)  Wt 240 lb (108.863 kg)  BMI 35.43 kg/m2  SpO2 97%  Physical Exam  Nursing note and vitals reviewed. Constitutional: He is oriented to person, place, and time. He appears well-developed and well-nourished.  Non-toxic appearance.  HENT:  Head: Normocephalic.  Right Ear: Tympanic membrane and external ear normal.  Left Ear: Tympanic membrane and external ear normal.  Eyes: EOM and lids are normal. Pupils are equal, round, and reactive to light.  Neck: Normal range of motion. Neck supple. Carotid bruit is not present.  Cardiovascular: Normal rate, regular rhythm, normal heart sounds, intact distal pulses and normal pulses.   Pulmonary/Chest: No respiratory distress. He has no wheezes. He has rhonchi.  Abdominal: Soft. Bowel sounds are normal. There is no tenderness. There is no guarding.  Musculoskeletal: Normal range of motion. He exhibits no edema.  Lumbar  area pain to palpation. Her spinal area pain and some spasm in the lumbar region. There no hot areas. There is no palpable step off. There is pain with attempted right straight leg raise. There is good range of motion of the right hip knee and ankle.  Lymphadenopathy:       Head (right side): No submandibular adenopathy present.       Head (left side): No submandibular adenopathy present.    He has no cervical adenopathy.  Neurological: He is alert and oriented to  person, place, and time. He has normal strength. No cranial nerve deficit or sensory deficit.  Skin: Skin is warm and dry.  Psychiatric: He has a normal mood and affect. His speech is normal.    ED Course  Procedures (including critical care time)  Labs Reviewed - No data to display No results found.   No diagnosis found.    MDM  I have reviewed nursing notes, vital signs, and all appropriate lab and imaging results for this patient. Patient has a history of chronic back pain in particular chronic lumbar area pain. On yesterday April 12 the patient was putting up a post for a fence when he fell backwards and we injured his back. Patient presents to the emergency department for assistance with this discomfort.  No gross neurologic deficits appreciated at this time. There is no palpable step off or excessive bruising noted of the lower back area. The plan at this time is for the patient to be placed on Decadron daily, baclofen and tylenol#3 - #20. Pt does not have a primary MD. Encouraged to call Dr Regino Schultze ( on medical call) and orthopedics concerning his back.       Kathie Dike, PA-C 05/14/12 [phone]

## 2012-06-20 ENCOUNTER — Encounter (HOSPITAL_COMMUNITY): Payer: Self-pay | Admitting: *Deleted

## 2012-06-20 ENCOUNTER — Emergency Department (HOSPITAL_COMMUNITY)
Admission: EM | Admit: 2012-06-20 | Discharge: 2012-06-20 | Disposition: A | Discharge status: Home / Self Care | Payer: Self-pay | Attending: Emergency Medicine | Admitting: Emergency Medicine

## 2012-06-20 DIAGNOSIS — F17200 Nicotine dependence, unspecified, uncomplicated: Secondary | ICD-10-CM | POA: Insufficient documentation

## 2012-06-20 DIAGNOSIS — G8929 Other chronic pain: Secondary | ICD-10-CM | POA: Insufficient documentation

## 2012-06-20 DIAGNOSIS — K029 Dental caries, unspecified: Secondary | ICD-10-CM | POA: Insufficient documentation

## 2012-06-20 DIAGNOSIS — Z8679 Personal history of other diseases of the circulatory system: Secondary | ICD-10-CM | POA: Insufficient documentation

## 2012-06-20 DIAGNOSIS — K219 Gastro-esophageal reflux disease without esophagitis: Secondary | ICD-10-CM | POA: Insufficient documentation

## 2012-06-20 DIAGNOSIS — K0889 Other specified disorders of teeth and supporting structures: Secondary | ICD-10-CM

## 2012-06-20 DIAGNOSIS — Z8719 Personal history of other diseases of the digestive system: Secondary | ICD-10-CM | POA: Insufficient documentation

## 2012-06-20 DIAGNOSIS — K089 Disorder of teeth and supporting structures, unspecified: Secondary | ICD-10-CM | POA: Insufficient documentation

## 2012-06-20 MED ORDER — HYDROCODONE-ACETAMINOPHEN 5-325 MG PO TABS: ORAL_TABLET | ORAL | Status: DC

## 2012-06-20 MED ORDER — PENICILLIN V POTASSIUM 250 MG PO TABS: 250.0000 mg | ORAL_TABLET | Freq: Four times a day (QID) | ORAL | Status: DC

## 2012-06-20 NOTE — ED Provider Notes (Signed)
History     CSN: [id number]  Arrival date & time 06/20/12  1634   First MD Initiated Contact with Patient 06/20/12 1646      Chief Complaint  Patient presents with  . Dental Pain     HPI Pt was seen at 1650. Per pt, c/o gradual onset and persistence of constant left lower tooth "pain" since this morning.  Pt states he called his dentist today but cannot be seen in his office until Friday (3 days from now).  Denies fevers, no intra-oral edema, no rash, no facial swelling, no dysphagia, no neck pain.   The condition is aggravated by nothing. The condition is relieved by nothing. The symptoms have been associated with no other complaints. The patient has no significant history of serious medical conditions.    Past Medical History  Diagnosis Date  . Irregular heartbeat   . Chronic back pain   . Chronic lumbar pain   . GERD (gastroesophageal reflux disease)   . Rectal polyp     Past Surgical History  Procedure Laterality Date  . 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 Smoking status: Current Every Day Smoker    Types: Cigarettes  . Smokeless tobacco: Not on file  . Alcohol Use: No      Review of Systems ROS: Statement: All systems negative except as marked or noted in the HPI; Constitutional: Negative for fever and chills. ; ; Eyes: Negative for eye pain and discharge. ; ; ENMT: Positive for dental caries, dental hygiene poor and toothache. Negative for ear pain, bleeding gums, dental injury, facial deformity, facial swelling, hoarseness, nasal congestion, sinus pressure, sore throat, throat swelling and tongue swollen. ; ; Cardiovascular: Negative for chest pain, palpitations, diaphoresis, dyspnea and peripheral edema. ; ; Respiratory: Negative for cough, wheezing and stridor. ; ; Gastrointestinal: Negative for nausea, vomiting, diarrhea and abdominal pain. ; ; Genitourinary: Negative for dysuria, flank pain and  hematuria. ; ; Musculoskeletal: Negative for back pain and neck pain. ; ; Skin: Negative for rash and skin lesion. ; ; Neuro: Negative for headache, lightheadedness and neck stiffness. ;       Allergies  Ibuprofen and Ketorolac tromethamine  Home Medications   Current Outpatient Rx  Name  Route  Sig  Dispense  Refill  . acetaminophen (TYLENOL) 500 MG tablet   Oral   Take 1,000 mg by mouth every 6 (six) hours as needed. For pain         . acetaminophen-codeine (TYLENOL #3) 300-30 MG per tablet   Oral   Take 1-2 tablets by mouth every 6 (six) hours as needed for pain.   20 tablet   0   . dexamethasone (DECADRON) 6 MG tablet      1 po daily with food   7 tablet   0   . HYDROcodone-acetaminophen (NORCO/VICODIN) 5-325 MG per tablet      1 or 2 tabs PO q6 hours prn pain   20 tablet   0   . penicillin v potassium (VEETID) 250 MG tablet   Oral   Take 1 tablet (250 mg total) by mouth 4 (four) times daily.   20 tablet   0     BP 110/81  Pulse 86  Temp(Src) 98.6 F (37 C) (Oral)  Resp 20  Ht 5' 10.5" (1.791 m)  Wt 240 lb (108.863 kg)  BMI 33.94 kg/m2  SpO2 99%  Physical Exam 1655: Physical examination: Vital signs and O2 SAT: Reviewed; Constitutional: Well developed, Well nourished, Well hydrated, In no acute distress; Head and Face: Normocephalic, Atraumatic; Eyes: EOMI, PERRL, No scleral icterus; ENMT: Mouth and pharynx normal, Poor dentition, Widespread dental decay, Left TM normal, Right TM normal, Mucous membranes moist, +lower left 1st molar with dental decay.  No gingival erythema, edema, fluctuance, or drainage.  No intra-oral edema. No submandibular or sublingual edema. No hoarse voice, no drooling, no stridor.  ; Neck: Supple, Full range of motion, No lymphadenopathy; Cardiovascular: Regular rate and rhythm, No murmur, rub, or gallop; Respiratory: Breath sounds clear & equal bilaterally, No rales, rhonchi, wheezes, Normal respiratory effort/excursion; Chest:  Nontender, Movement normal; Extremities: Pulses normal, No tenderness, No edema; Neuro: AA&Ox3, Major CN grossly intact.  No gross focal motor or sensory deficits in extremities.; Skin: Color normal, No rash, No petechiae, Warm, Dry   ED Course  Procedures     MDM  MDM Reviewed: previous chart, nursing note and vitals     1700:  Pt encouraged to f/u with dentist or oral surgeon for his dental needs for good continuity of care and definitive treatment.  Verb understanding.         Laray Anger, DO 06/22/12 [phone]

## 2012-06-20 NOTE — ED Notes (Signed)
Dental pain, says his tooth is broken.

## 2012-07-19 ENCOUNTER — Emergency Department (HOSPITAL_COMMUNITY)
Admission: EM | Admit: 2012-07-19 | Discharge: 2012-07-19 | Disposition: A | Discharge status: Home / Self Care | Payer: Self-pay | Attending: Emergency Medicine | Admitting: Emergency Medicine

## 2012-07-19 ENCOUNTER — Encounter (HOSPITAL_COMMUNITY): Payer: Self-pay | Admitting: *Deleted

## 2012-07-19 DIAGNOSIS — F17200 Nicotine dependence, unspecified, uncomplicated: Secondary | ICD-10-CM | POA: Insufficient documentation

## 2012-07-19 DIAGNOSIS — Z8719 Personal history of other diseases of the digestive system: Secondary | ICD-10-CM | POA: Insufficient documentation

## 2012-07-19 DIAGNOSIS — K089 Disorder of teeth and supporting structures, unspecified: Secondary | ICD-10-CM | POA: Insufficient documentation

## 2012-07-19 DIAGNOSIS — Z8679 Personal history of other diseases of the circulatory system: Secondary | ICD-10-CM | POA: Insufficient documentation

## 2012-07-19 DIAGNOSIS — G8929 Other chronic pain: Secondary | ICD-10-CM | POA: Insufficient documentation

## 2012-07-19 DIAGNOSIS — K0889 Other specified disorders of teeth and supporting structures: Secondary | ICD-10-CM

## 2012-07-19 MED ORDER — PENICILLIN V POTASSIUM 500 MG PO TABS: 500.0000 mg | ORAL_TABLET | Freq: Four times a day (QID) | ORAL | Status: AC

## 2012-07-19 MED ORDER — OXYCODONE-ACETAMINOPHEN 5-325 MG PO TABS: 2.0000 | ORAL_TABLET | ORAL | Status: DC | PRN

## 2012-07-19 NOTE — ED Provider Notes (Signed)
History    This chart was scribed for Glynn Octave, MD by Quintella Reichert, ED scribe.  This patient was seen in room APA09/[facility]09 and the patient's care was started at 5:11 PM.   CSN: [id number]  Arrival date & time 07/19/12  1655       Chief Complaint  Patient presents with  . Dental Pain     The history is provided by the patient. No language interpreter was used.    HPI Comments: John Clark is a 42 y.o. male who presents to the Emergency Department complaining of severe, constant left-sided dental pain that began this morning.  Pt states that he was eating breakfast when he "broke" a tooth in the area.   He notes that he can feel "a hole on both sides" of the tooth when he touches it with his tongue.  He describes pain as "like someone hit me in the head with a hammer."  He has attempted to treat pain with 2-3 BC powders and 6 350 mg Tylenols, without relief.  He denies fever, emesis, difficulty swallowing, SOB, or any other associated symptoms.  He denies DM or any other chronic medical conditions.  Pt has not made an appointment with his dentist.   Past Medical History  Diagnosis Date  . Irregular heartbeat   . Chronic back pain   . Chronic lumbar pain   . GERD (gastroesophageal reflux disease)   . Rectal polyp     Past Surgical History  Procedure Laterality Date  . Laceration repair      repair to right arm that severed artery    History reviewed. No pertinent family history.  History  Substance Use Topics  . Smoking status: Current Every Day Smoker    Types: Cigarettes  . Smokeless tobacco: Not on file  . Alcohol Use: No      Review of Systems A complete 10 system review of systems was obtained and all systems are negative except as noted in the HPI and PMH.    Allergies  Codeine; Ibuprofen; and Ketorolac tromethamine  Home Medications   Current Outpatient Rx  Name  Route  Sig  Dispense  Refill  . acetaminophen (TYLENOL) 500 MG tablet    Oral   Take 1,000 mg by mouth every 6 (six) hours as needed. For pain         . HYDROcodone-acetaminophen (NORCO/VICODIN) 5-325 MG per tablet      1 or 2 tabs PO q6 hours prn pain   20 tablet   0   . oxyCODONE-acetaminophen (PERCOCET/ROXICET) 5-325 MG per tablet   Oral   Take 2 tablets by mouth every 4 (four) hours as needed for pain.   15 tablet   0   . penicillin v potassium (VEETID) 250 MG tablet   Oral   Take 1 tablet (250 mg total) by mouth 4 (four) times daily.   20 tablet   0   . penicillin v potassium (VEETID) 500 MG tablet   Oral   Take 1 tablet (500 mg total) by mouth 4 (four) times daily.   40 tablet   0     BP 131/85  Pulse 91  Temp(Src) 98.3 F (36.8 C) (Oral)  Resp 18  Ht 5\' 9"  (1.753 m)  Wt 240 lb (108.863 kg)  BMI 35.43 kg/m2  SpO2 96%  Physical Exam  Nursing note and vitals reviewed. Constitutional: He is oriented to person, place, and time. He appears well-developed and well-nourished. No  distress.  HENT:  Head: Normocephalic and atraumatic.  Small chip to left lower molar, no abscess. Floor of mouth soft.  Eyes: EOM are normal.  Neck: Neck supple. No tracheal deviation present.  Cardiovascular: Normal rate.   Pulmonary/Chest: Effort normal. No respiratory distress.  Musculoskeletal: Normal range of motion.  Neurological: He is alert and oriented to person, place, and time.  Skin: Skin is warm and dry.  Psychiatric: He has a normal mood and affect. His behavior is normal.    ED Course  Procedures (including critical care time)  DIAGNOSTIC STUDIES: Oxygen Saturation is 96% on room air, normal by my interpretation.    COORDINATION OF CARE: 5:15 PM-Discussed treatment plan which includes pain medication and referral to a dentist with pt at bedside and pt agreed to plan.      Labs Reviewed - No data to display No results found.   1. Pain, dental       MDM  L lower molar pain since chipping it this morning.  Denies fever or  vomiting.  No difficulty breathing or swallowing. No evidence of abscess. No ludwig's angina.  Pain control, abx, followup with dentistry.      I personally performed the services described in this documentation, which was scribed in my presence. The recorded information has been reviewed and is accurate.    Glynn Octave, MD 07/19/12 1723

## 2012-07-19 NOTE — ED Notes (Signed)
Dental pain, onset this am

## 2012-08-05 ENCOUNTER — Emergency Department (HOSPITAL_COMMUNITY)
Admission: EM | Admit: 2012-08-05 | Discharge: 2012-08-05 | Disposition: A | Discharge status: Home / Self Care | Payer: Self-pay | Attending: Emergency Medicine | Admitting: Emergency Medicine

## 2012-08-05 ENCOUNTER — Encounter (HOSPITAL_COMMUNITY): Payer: Self-pay | Admitting: *Deleted

## 2012-08-05 DIAGNOSIS — Z8679 Personal history of other diseases of the circulatory system: Secondary | ICD-10-CM | POA: Insufficient documentation

## 2012-08-05 DIAGNOSIS — IMO0001 Reserved for inherently not codable concepts without codable children: Secondary | ICD-10-CM | POA: Insufficient documentation

## 2012-08-05 DIAGNOSIS — Z8719 Personal history of other diseases of the digestive system: Secondary | ICD-10-CM | POA: Insufficient documentation

## 2012-08-05 DIAGNOSIS — R0602 Shortness of breath: Secondary | ICD-10-CM | POA: Insufficient documentation

## 2012-08-05 DIAGNOSIS — G8929 Other chronic pain: Secondary | ICD-10-CM | POA: Insufficient documentation

## 2012-08-05 DIAGNOSIS — M79609 Pain in unspecified limb: Secondary | ICD-10-CM | POA: Insufficient documentation

## 2012-08-05 DIAGNOSIS — R209 Unspecified disturbances of skin sensation: Secondary | ICD-10-CM | POA: Insufficient documentation

## 2012-08-05 DIAGNOSIS — F17200 Nicotine dependence, unspecified, uncomplicated: Secondary | ICD-10-CM | POA: Insufficient documentation

## 2012-08-05 LAB — BASIC METABOLIC PANEL
BUN: 17 mg/dL (ref 6–23)
Calcium: 8.5 mg/dL (ref 8.4–10.5)
Chloride: 102 mEq/L (ref 96–112)
Creatinine, Ser: 0.89 mg/dL (ref 0.50–1.35)
GFR calc Af Amer: >90 mL/min (ref >90)
GFR calc non Af Amer: >90 mL/min (ref >90)

## 2012-08-05 LAB — CBC
HCT: 41.2 % (ref 39.0–52.0)
MCH: 31.8 pg (ref 26.0–34.0)
MCHC: 35.2 g/dL (ref 30.0–36.0)
MCV: 90.4 fL (ref 78.0–100.0)
RDW: 13 % (ref 11.5–15.5)

## 2012-08-05 MED ORDER — MORPHINE SULFATE 4 MG/ML IJ SOLN
4.0000 mg | Freq: Once | INTRAMUSCULAR | Status: AC
  Administered 2012-08-05: 4 mg via INTRAVENOUS
  Filled 2012-08-05: qty 1

## 2012-08-05 MED ORDER — OXYCODONE-ACETAMINOPHEN 5-325 MG PO TABS: 1.0000 | ORAL_TABLET | Freq: Four times a day (QID) | ORAL | Status: DC | PRN

## 2012-08-05 NOTE — ED Notes (Signed)
The pt is now c/o some chest pain 

## 2012-08-05 NOTE — ED Notes (Signed)
PT endorses numbness and pain in right fingers and right leg. Unable to differentiate sharp/ dull

## 2012-08-05 NOTE — ED Provider Notes (Signed)
History    CSN: [id number] Arrival date & time 08/05/12  1845  First MD Initiated Contact with Patient 08/05/12 2022     Chief Complaint  Patient presents with  . Multiple complaints    (Consider location/radiation/quality/duration/timing/severity/associated sxs/prior Treatment) HPI Comments: 42 y.o. 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 Pt describes the pain as burning, radiating, and intermittent. Pt states fingers and toes are not numb here in the ER today. Pt has not sought medical help or taken any interventions. Pt admits mild shortness of breath. He states it is not better or worse with movement. He denies any recent trauma, fevers, decreased range of motion, chest pain, nausea, vomiting.   Past Medical History  Diagnosis Date  . Irregular heartbeat   . Chronic back pain   . Chronic lumbar pain   . GERD (gastroesophageal reflux disease)   . Rectal polyp    Past Surgical History  Procedure Laterality Date  . 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 History  Substance Use Topics  . Smoking status: Current Every Day Smoker    Types: Cigarettes  . Smokeless tobacco: Not on file  . Alcohol Use: No    Review of Systems  Constitutional: Negative for fever and diaphoresis.  HENT: Negative for neck pain and neck stiffness.   Eyes: Negative for visual disturbance.  Respiratory: Positive for shortness of breath. Negative for apnea and chest tightness.   Cardiovascular: Negative for chest pain and palpitations.  Gastrointestinal: Negative for nausea, vomiting, diarrhea and constipation.  Genitourinary: Negative for dysuria.  Musculoskeletal: Positive for  myalgias. Negative for gait problem.       Right sided upper arm and lower leg pain  Skin: Negative for color change and rash.  Neurological: Positive for numbness. Negative for dizziness, weakness, light-headedness and headaches.       Right sided finger tips and toes, intermittent, currently resolved    Allergies  Codeine; Ibuprofen; and Ketorolac tromethamine  Home Medications  No current outpatient prescriptions on file. BP 112/67  Pulse 78  Temp(Src) 98.4 F (36.9 C) (Oral)  Resp 18  Wt 253 lb (114.76 kg)  BMI 37.34 kg/m2  SpO2 97% Physical Exam  Nursing note and vitals reviewed. Constitutional: He is oriented to person, place, and time. He appears well-developed and well-nourished. No distress.  Morbidly obese, uncomfortable  HENT:  Head: Normocephalic and atraumatic.  Eyes: Conjunctivae and EOM are normal.  Neck: Normal range of motion. Neck supple.  No meningeal signs  Cardiovascular: Normal rate, regular rhythm, normal heart sounds and intact distal pulses.  Exam reveals no gallop and no friction rub.   No murmur heard. Strong radial and DP pulses bilaterally, good cap refill of upper and lower extremities  Pulmonary/Chest: Effort normal and breath sounds normal. No respiratory distress. He has no wheezes. He has no rales. He exhibits no tenderness.  Abdominal: Soft. Bowel sounds are normal. He exhibits no distension. There is no tenderness. There is no rebound and no guarding.  Musculoskeletal: Normal range of motion. He exhibits no edema and no tenderness.  FROM to upper and lower  extremities, mild tenderness to palpation at right brachial plexus and right calf, no palpable cord  Neurological: He is alert and oriented to person, place, and time. No cranial nerve deficit.  Speech is clear and goal oriented, follows commands Sensation normal to light touch and two point discrimination Moves extremities without ataxia, coordination intact Normal gait and  balance Normal strength in upper and lower extremities bilaterally including dorsiflexion and plantar flexion, strong and equal grip strength   Skin: Skin is warm and dry. He is not diaphoretic. No erythema.  Skin is warm with no mottling or discoloration to suggest vascular compromise  Psychiatric: He has a normal mood and affect.    ED Course  Procedures (including critical care time)  Date: 08/05/2012  Rate: 92  Rhythm: normal sinus rhythm  QRS Axis: normal  Intervals: normal  ST/T Wave abnormalities: nonspecific T wave changes  Conduction Disutrbances:none  Narrative Interpretation: abnormal EKG Old EKG Reviewed: changes noted to EKG of  04/17/12, no T wave abnormalities   Labs Reviewed  BASIC METABOLIC PANEL - Abnormal; Notable for the following:    Glucose, Bld 109 (*)    All other components within normal limits  CBC  PRO B NATRIURETIC PEPTIDE  D-DIMER, QUANTITATIVE  PROTIME-INR  POCT I-STAT TROPONIN I   No results found. 1. Upper arm pain, right   2. Lower leg pain, right     MDM  Review of records shows that pt has been seen in the ED for right arm and hand pain before, usually as the result of some trauma which he denies is the case today. Pain seems radicular or possible vascular in nature. Pt is PERC negative with low suspicion for clots. Risk factors do include obesity, smoking. However, pt denies cough, a history of travel, immobilization, surgery, fevers, cancer, oral contraceptives or hormone use, swelling of the legs. The patient has no history of venous thromboembolism.   On physical exam, pt has full active and passive ROM to upper and lower extremities bilaterally, no palplable cord is appreciated, slight tenderness to palpation of right bicep and right calf. No lower extremity edema. Good distal pulses in both upper and lower extremities. Skin with good color, no mottling or discoloration to suggest emergent vascular issue. Will get d-dimer and check his  INR in addition to basic labs, EKG, and re-evaluate.   Labs including d-dimer and INR, EKG, and troponin are within normal limits. Pain has been managed with morphine. Pt states he is feeling better and would like to go home. Impressed upon the pt the importance of following up with a primary care doctor. Provided resource list. Discussed reasons to seek immediate care. Patient expresses understanding and agrees with plan.   Glade Nurse, PA-C 08/06/12 [phone]

## 2012-08-05 NOTE — ED Provider Notes (Signed)
Date: 08/05/2012  Rate: 92  Rhythm: normal sinus rhythm  QRS Axis: normal  Intervals: normal  ST/T Wave abnormalities: nonspecific T wave changes  Conduction Disutrbances:none  Narrative Interpretation: nonspecific T wave changes in the inferior leads. When compared with ECG of 04/17/2012, nonspecific T-wave changes are now present.  Old EKG Reviewed: changes noted   Medical screening examination/treatment/procedure(s) were performed by non-physician practitioner and as supervising physician I was immediately available for consultation/collaboration.   Dione Booze, MD 08/05/12 1946

## 2012-08-05 NOTE — ED Notes (Signed)
The pt has pain in his rt arm when he had surgery 3-4 years ago.  More pain in his fingers and he has had numbness in his rt lower extremity.  He has had pain innhis rt leg for 2 days with some sonb. None now.  He is also c/o numbness in his fingers  For 2 days.

## 2012-08-05 NOTE — ED Notes (Signed)
PA at bedside.

## 2012-08-13 NOTE — ED Provider Notes (Signed)
Medical screening examination/treatment/procedure(s) were performed by non-physician practitioner and as supervising physician I was immediately available for consultation/collaboration.   Dione Booze, MD 08/13/12 1300

## 2012-08-26 ENCOUNTER — Emergency Department (HOSPITAL_COMMUNITY)
Admission: EM | Admit: 2012-08-26 | Discharge: 2012-08-26 | Disposition: A | Discharge status: Home / Self Care | Payer: Self-pay | Attending: Emergency Medicine | Admitting: Emergency Medicine

## 2012-08-26 ENCOUNTER — Emergency Department (HOSPITAL_COMMUNITY): Payer: Self-pay

## 2012-08-26 ENCOUNTER — Encounter (HOSPITAL_COMMUNITY): Payer: Self-pay

## 2012-08-26 DIAGNOSIS — Z8719 Personal history of other diseases of the digestive system: Secondary | ICD-10-CM | POA: Insufficient documentation

## 2012-08-26 DIAGNOSIS — M546 Pain in thoracic spine: Secondary | ICD-10-CM

## 2012-08-26 DIAGNOSIS — W11XXXA Fall on and from ladder, initial encounter: Secondary | ICD-10-CM | POA: Insufficient documentation

## 2012-08-26 DIAGNOSIS — G8929 Other chronic pain: Secondary | ICD-10-CM | POA: Insufficient documentation

## 2012-08-26 DIAGNOSIS — Y9389 Activity, other specified: Secondary | ICD-10-CM | POA: Insufficient documentation

## 2012-08-26 DIAGNOSIS — IMO0002 Reserved for concepts with insufficient information to code with codable children: Secondary | ICD-10-CM | POA: Insufficient documentation

## 2012-08-26 DIAGNOSIS — Z8679 Personal history of other diseases of the circulatory system: Secondary | ICD-10-CM | POA: Insufficient documentation

## 2012-08-26 DIAGNOSIS — Y92009 Unspecified place in unspecified non-institutional (private) residence as the place of occurrence of the external cause: Secondary | ICD-10-CM | POA: Insufficient documentation

## 2012-08-26 DIAGNOSIS — F17200 Nicotine dependence, unspecified, uncomplicated: Secondary | ICD-10-CM | POA: Insufficient documentation

## 2012-08-26 DIAGNOSIS — M545 Low back pain, unspecified: Secondary | ICD-10-CM

## 2012-08-26 MED ORDER — OXYCODONE-ACETAMINOPHEN 5-325 MG PO TABS: 2.0000 | ORAL_TABLET | ORAL | Status: DC | PRN

## 2012-08-26 MED ORDER — OXYCODONE-ACETAMINOPHEN 5-325 MG PO TABS
1.0000 | ORAL_TABLET | Freq: Once | ORAL | Status: AC
  Administered 2012-08-26: 1 via ORAL
  Filled 2012-08-26: qty 1

## 2012-08-26 MED ORDER — CYCLOBENZAPRINE HCL 10 MG PO TABS: 10.0000 mg | ORAL_TABLET | Freq: Two times a day (BID) | ORAL | Status: DC | PRN

## 2012-08-26 NOTE — ED Provider Notes (Signed)
Medical screening examination/treatment/procedure(s) were performed by non-physician practitioner and as supervising physician I was immediately available for consultation/collaboration.   Ronen Bromwell III, MD 08/26/12 1934 

## 2012-08-26 NOTE — ED Provider Notes (Signed)
CSN: [id number]     Arrival date & time 08/26/12  0846 History     First MD Initiated Contact with Patient 08/26/12 0940     Chief Complaint  Patient presents with  . Back Pain   (Consider location/radiation/quality/duration/timing/severity/associated sxs/prior Treatment) Patient is a 42 y.o. male presenting with back pain. The history is provided by the patient.  Back Pain Location:  Thoracic spine and lumbar spine Quality:  Shooting and stabbing Radiates to:  Does not radiate Pain severity:  Severe (9/10) Pain is:  Same all the time Onset quality:  Sudden Duration:  15 hours Timing:  Constant Progression:  Worsening Chronicity:  New Context: falling   Relieved by:  Nothing Worsened by:  Ambulation, coughing, movement and standing Ineffective treatments:  Ibuprofen Associated symptoms: no abdominal pain, no bladder incontinence, no bowel incontinence, no dysuria, no fever, no leg pain, no numbness, no tingling and no weakness    John Clark is a 42 y.o. male who presents to the ED with back pain. He states that he was helping a friend put shutters on a house. Patient was standing on a 6 foot ladder about 2 steps from the top when he ladder fell and he hit the ground on his back. He got up, left and took tylenol then motrin and heat. Went to bed and this morning so much pain could hardly get out of bed.   Past Medical History  Diagnosis Date  . Irregular heartbeat   . Chronic back pain   . Chronic lumbar pain   . GERD (gastroesophageal reflux disease)   . Rectal polyp    Past Surgical History  Procedure Laterality Date  . Laceration repair      repair to right arm that severed artery  . Arm surgery     No family history on file. History  Substance Use Topics  . Smoking status: Current Every Day Smoker    Types: Cigarettes  . Smokeless tobacco: Not on file  . Alcohol Use: No    Review of Systems  Constitutional: Negative for fever and chills.    Gastrointestinal: Negative for vomiting, abdominal pain, diarrhea and bowel incontinence.  Genitourinary: Negative for bladder incontinence, dysuria and frequency.  Musculoskeletal: Positive for back pain. Gait problem: due to pain.  Skin: Negative for wound.  Neurological: Negative for tingling, weakness and numbness.  Psychiatric/Behavioral: The patient is not nervous/anxious.     Allergies  Codeine; Ibuprofen; and Ketorolac tromethamine  Home Medications   Current Outpatient Rx  Name  Route  Sig  Dispense  Refill  . oxyCODONE-acetaminophen (PERCOCET/ROXICET) 5-325 MG per tablet   Oral   Take 1 tablet by mouth every 6 (six) hours as needed for pain. May take 2 tablets PO q 6 hours for severe pain - Do not take with Tylenol as this tablet already contains tylenol   15 tablet   0    BP 129/80  Pulse 87  Temp(Src) 98 F (36.7 C) (Oral)  Resp 18  Ht 5\' 10"  (1.778 m)  Wt 230 lb (104.327 kg)  BMI 33 kg/m2  SpO2 96% Physical Exam  Nursing note and vitals reviewed. Constitutional: He is oriented to person, place, and time. He appears well-developed and well-nourished. No distress.  Eyes: EOM are normal.  Neck: Normal range of motion. Neck supple.  Cardiovascular: Normal rate and regular rhythm.   Pulmonary/Chest: Effort normal.  Abdominal: Soft. There is no tenderness.  Musculoskeletal:       Lumbar  back: He exhibits decreased range of motion, tenderness and spasm. He exhibits no deformity, no laceration and normal pulse.       Back:  Pedal pulses equal, adequate circulation, good touch sensation. Pain with palpation and range of motion of thoracic and lumbar areas.   Neurological: He is alert and oriented to person, place, and time. He has normal strength and normal reflexes. No cranial nerve deficit or sensory deficit. Gait normal.  Ambulatory without foot drop  Skin: Skin is warm and dry.  Psychiatric: He has a normal mood and affect. His behavior is normal.   Dg  Thoracic Spine 2 View  08/26/2012   *RADIOLOGY REPORT*  Clinical Data: History of trauma from a fall.  Back pain.  THORACIC SPINE - 2 VIEW  Comparison: Lateral chest radiograph 04/17/2012.  Findings: Four views thoracic spine demonstrate no definite acute displaced fractures or definite compression type fractures.  There is multilevel degenerative disc disease throughout the mid thoracic spine.  No definite paravertebral soft tissue swelling.  Visualized portions of the thorax appear unremarkable.  IMPRESSION: 1.  No acute radiographic abnormality of the thoracic spine. 2.  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   Original Report Authenticated By: Trudie Reed, M.D.   Dg Lumbar Spine Complete  08/26/2012   *RADIOLOGY REPORT*  Clinical Data: History of fall complaining of back pain.  LUMBAR SPINE - COMPLETE 4+ VIEW  Comparison: 09/17/2010.  Findings: Five views of the lumbar spine demonstrate no definite acute displaced fracture or compression type fracture.  No defects of the pars interarticularis are noted.  Alignment is anatomic. There is mild multilevel degenerative disc disease and facet arthropathy, most severe at L5-S1.  IMPRESSION: 1.  No acute radiographic abnormality of the lumbar spine. 2.  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   Original Report Authenticated By: Trudie Reed, M.D.    ED Course   Procedures  MDM  42 y.o. male with thoracic and lumbar pain s/p fall from 6 foot ladder yesterday. Patient ambulatory and stable for discharge with pain management. If his symptoms do no improve he will follow up with Dr. Hilda Lias on Monday. If symptoms worsen he will return here. Neuro exam remains normal.  Discussed with the patient x-ray and clinical findings and plan of care. All questioned fully answered.    Medication List         cyclobenzaprine 10 MG tablet  Commonly known as:  FLEXERIL  Take 1 tablet (10  mg total) by mouth 2 (two) times daily as needed for muscle spasms.     oxyCODONE-acetaminophen 5-325 MG per tablet  Commonly known as:  PERCOCET/ROXICET  Take 2 tablets by mouth every 4 (four) hours as needed for pain.         Janne Napoleon, [place] 08/26/12 1122

## 2012-08-26 NOTE — ED Notes (Signed)
Pt reports fell off of a 84ft ladder last night.  C/O pain to mid to lower back.  Denies any head or neck pain

## 2012-09-23 ENCOUNTER — Emergency Department (HOSPITAL_COMMUNITY): Payer: Self-pay

## 2012-09-23 ENCOUNTER — Encounter (HOSPITAL_COMMUNITY): Payer: Self-pay | Admitting: *Deleted

## 2012-09-23 ENCOUNTER — Emergency Department (HOSPITAL_COMMUNITY)
Admission: EM | Admit: 2012-09-23 | Discharge: 2012-09-23 | Disposition: A | Discharge status: Home / Self Care | Payer: Self-pay | Attending: Emergency Medicine | Admitting: Emergency Medicine

## 2012-09-23 DIAGNOSIS — S9782XA Crushing injury of left foot, initial encounter: Secondary | ICD-10-CM

## 2012-09-23 DIAGNOSIS — Z8679 Personal history of other diseases of the circulatory system: Secondary | ICD-10-CM | POA: Insufficient documentation

## 2012-09-23 DIAGNOSIS — R259 Unspecified abnormal involuntary movements: Secondary | ICD-10-CM | POA: Insufficient documentation

## 2012-09-23 DIAGNOSIS — W208XXA Other cause of strike by thrown, projected or falling object, initial encounter: Secondary | ICD-10-CM | POA: Insufficient documentation

## 2012-09-23 DIAGNOSIS — Z8739 Personal history of other diseases of the musculoskeletal system and connective tissue: Secondary | ICD-10-CM | POA: Insufficient documentation

## 2012-09-23 DIAGNOSIS — Z8719 Personal history of other diseases of the digestive system: Secondary | ICD-10-CM | POA: Insufficient documentation

## 2012-09-23 DIAGNOSIS — Y929 Unspecified place or not applicable: Secondary | ICD-10-CM | POA: Insufficient documentation

## 2012-09-23 DIAGNOSIS — Y9389 Activity, other specified: Secondary | ICD-10-CM | POA: Insufficient documentation

## 2012-09-23 DIAGNOSIS — G8929 Other chronic pain: Secondary | ICD-10-CM | POA: Insufficient documentation

## 2012-09-23 DIAGNOSIS — F17200 Nicotine dependence, unspecified, uncomplicated: Secondary | ICD-10-CM | POA: Insufficient documentation

## 2012-09-23 DIAGNOSIS — S9780XA Crushing injury of unspecified foot, initial encounter: Secondary | ICD-10-CM | POA: Insufficient documentation

## 2012-09-23 MED ORDER — HYDROMORPHONE HCL PF 1 MG/ML IJ SOLN
1.0000 mg | Freq: Once | INTRAMUSCULAR | Status: AC
Start: 2012-09-23 — End: 2012-09-23
  Administered 2012-09-23: 1 mg via INTRAMUSCULAR
  Filled 2012-09-23: qty 1

## 2012-09-23 MED ORDER — OXYCODONE-ACETAMINOPHEN 5-325 MG PO TABS: 1.0000 | ORAL_TABLET | ORAL | Status: DC | PRN

## 2012-09-23 MED ORDER — OXYCODONE-ACETAMINOPHEN 5-325 MG PO TABS
1.0000 | ORAL_TABLET | Freq: Once | ORAL | Status: AC
  Administered 2012-09-23: 1 via ORAL
  Filled 2012-09-23: qty 1

## 2012-09-23 NOTE — ED Notes (Addendum)
Pt states that he was at work yesteday and a 150lb piece of concrete fell on left foot, pt c/o "knot" to top of left foot, cms intact

## 2012-09-23 NOTE — ED Notes (Signed)
Pt c/o left foot and ankle pain since last night. Pt states he dropped a cement bag on his foot last night. Pt presents with bruising and swelling to left foot.

## 2012-09-26 NOTE — ED Provider Notes (Signed)
CSN: [id number]     Arrival date & time 09/23/12  1109 History   First MD Initiated Contact with Patient 09/23/12 1122     Chief Complaint  Patient presents with  . Foot Pain   (Consider location/radiation/quality/duration/timing/severity/associated sxs/prior Treatment) HPI Comments: John Clark is a 42 y.o. Male presenting with increased persistent pain in his left dorsal foot and ankle since dropping a 150 lb piece of concrete block on his foot last night as he and a friend were attempting to slide it off the back of a pickup.  He has used ice, elevation and tylenol without any improvement in his pain which is constant, sharp and throbbing with radiation into the ankle.  He has sensation distal to the injury site and can wiggle his toes, but is uncomfortable with this movement.  He denies other injury.  This was not a work related injury.     The history is provided by the patient.    Past Medical History  Diagnosis Date  . Irregular heartbeat   . Chronic back pain   . Chronic lumbar pain   . GERD (gastroesophageal reflux disease)   . Rectal polyp    Past Surgical History  Procedure Laterality Date  . Laceration repair      repair to right arm that severed artery  . Arm surgery     No family history on file. History  Substance Use Topics  . Smoking status: Current Every Day Smoker    Types: Cigarettes  . Smokeless tobacco: Not on file  . Alcohol Use: No    Review of Systems  Constitutional: Negative for fever.  Musculoskeletal: Positive for joint swelling, arthralgias and gait problem. Negative for myalgias.  Neurological: Negative for weakness and numbness.    Allergies  Codeine; Ibuprofen; and Ketorolac tromethamine  Home Medications   Current Outpatient Rx  Name  Route  Sig  Dispense  Refill  . acetaminophen (TYLENOL) 500 MG tablet   Oral   Take 1,000 mg by mouth every 6 (six) hours as needed for pain.         Marland Kitchen oxyCODONE-acetaminophen  (PERCOCET/ROXICET) 5-325 MG per tablet   Oral   Take 1 tablet by mouth every 4 (four) hours as needed for pain.   20 tablet   0    BP 138/88  Pulse 82  Temp(Src) 98.1 F (36.7 C) (Oral)  Resp 18  SpO2 100% Physical Exam  Constitutional: He appears well-developed and well-nourished.  HENT:  Head: Atraumatic.  Neck: Normal range of motion.  Cardiovascular:  Pulses equal bilaterally  Musculoskeletal: He exhibits edema and tenderness.       Feet:  Point tender with edema at dorsal first tmt joint.  Less than 3 sec cap refill in toes,  Dorsalis pedis pulse intact,  Distal sensation intact.   Neurological: He is alert. He has normal strength. He displays normal reflexes. No sensory deficit.  Equal strength  Skin: Skin is warm and dry.  Psychiatric: He has a normal mood and affect.    ED Course  Procedures (including critical care time) Labs Review Labs Reviewed - No data to display Imaging Review No results found.  MDM   1. Crush injury of foot, left, initial encounter    Patients labs and/or radiological studies were viewed and considered during the medical decision making and disposition process.  Pt's pain is localized along with local edema at the radiographic site of the dorsal bone spur.  Concern for acute  injury despite chronic appearance on plain films.  Pt was encouraged ice, elevation. Posterior short leg splint applied, crutches given.  Oxycodone.  Advised to return here for any worsened pain, swelling or changes in foot and toe coloration.  Discussed low possibility of compartment sydrome and signs/sx to watch for and immediate return here for worsened sx, although no signs on todays exam.  He was referred to Dr. Romeo Apple for a recheck early next week.  Examined pt after splint application,  He can wiggle toes, less than 3 sec cap refill.  Burgess Amor, PA-C 09/26/12 1341

## 2012-09-28 NOTE — ED Provider Notes (Signed)
History/physical exam/procedure(s) were performed by non-physician practitioner and as supervising physician I was immediately available for consultation/collaboration. I have reviewed all notes and am in agreement with care and plan.   Hilario Quarry, MD 09/28/12 [phone]

## 2012-09-30 ENCOUNTER — Emergency Department (HOSPITAL_COMMUNITY)
Admission: EM | Admit: 2012-09-30 | Discharge: 2012-09-30 | Disposition: A | Discharge status: Home / Self Care | Payer: Self-pay | Attending: Emergency Medicine | Admitting: Emergency Medicine

## 2012-09-30 DIAGNOSIS — Y9389 Activity, other specified: Secondary | ICD-10-CM | POA: Insufficient documentation

## 2012-09-30 DIAGNOSIS — M79672 Pain in left foot: Secondary | ICD-10-CM

## 2012-09-30 DIAGNOSIS — Y929 Unspecified place or not applicable: Secondary | ICD-10-CM | POA: Insufficient documentation

## 2012-09-30 DIAGNOSIS — F17200 Nicotine dependence, unspecified, uncomplicated: Secondary | ICD-10-CM | POA: Insufficient documentation

## 2012-09-30 DIAGNOSIS — W208XXA Other cause of strike by thrown, projected or falling object, initial encounter: Secondary | ICD-10-CM | POA: Insufficient documentation

## 2012-09-30 DIAGNOSIS — IMO0002 Reserved for concepts with insufficient information to code with codable children: Secondary | ICD-10-CM | POA: Insufficient documentation

## 2012-09-30 DIAGNOSIS — S8990XA Unspecified injury of unspecified lower leg, initial encounter: Secondary | ICD-10-CM | POA: Insufficient documentation

## 2012-09-30 DIAGNOSIS — Z8719 Personal history of other diseases of the digestive system: Secondary | ICD-10-CM | POA: Insufficient documentation

## 2012-09-30 DIAGNOSIS — M549 Dorsalgia, unspecified: Secondary | ICD-10-CM

## 2012-09-30 MED ORDER — CYCLOBENZAPRINE HCL 10 MG PO TABS
10.0000 mg | ORAL_TABLET | Freq: Once | ORAL | Status: AC
  Administered 2012-09-30: 10 mg via ORAL
  Filled 2012-09-30: qty 1

## 2012-09-30 MED ORDER — TRAMADOL HCL 50 MG PO TABS: 50.0000 mg | ORAL_TABLET | Freq: Four times a day (QID) | ORAL | Status: DC | PRN

## 2012-09-30 MED ORDER — OXYCODONE-ACETAMINOPHEN 5-325 MG PO TABS
1.0000 | ORAL_TABLET | Freq: Once | ORAL | Status: AC
  Administered 2012-09-30: 1 via ORAL
  Filled 2012-09-30: qty 1

## 2012-09-30 MED ORDER — CYCLOBENZAPRINE HCL 10 MG PO TABS: 10.0000 mg | ORAL_TABLET | Freq: Three times a day (TID) | ORAL | Status: DC | PRN

## 2012-09-30 NOTE — ED Provider Notes (Signed)
CSN: [id number]     Arrival date & time 09/30/12  1420 History   First MD Initiated Contact with Patient 09/30/12 1520     Chief Complaint  Patient presents with  . Foot Pain  . Back Pain   (Consider location/radiation/quality/duration/timing/severity/associated sxs/prior Treatment) Patient is a 42 y.o. male presenting with back pain and foot injury. The history is provided by the patient.  Back Pain Location:  Lumbar spine Quality:  Aching Radiates to:  Does not radiate Pain severity:  Moderate Pain is:  Same all the time Onset quality:  Gradual Duration:  1 week Timing:  Constant Progression:  Unchanged Chronicity:  Chronic Context: lifting heavy objects and twisting   Relieved by:  Narcotics Worsened by:  Twisting and bending Ineffective treatments:  OTC medications Associated symptoms: no abdominal pain, no abdominal swelling, no bladder incontinence, no bowel incontinence, no chest pain, no dysuria, no fever, no leg pain, no numbness, no paresthesias, no pelvic pain, no perianal numbness, no tingling and no weakness   Foot Injury Location:  Foot Time since incident:  1 week Injury: yes   Mechanism of injury comment:  Direct blow to the foot after dropping a piece of concrete on his foot. Foot location:  L foot Pain details:    Quality:  Throbbing   Radiates to:  Does not radiate   Severity:  Moderate   Onset quality:  Sudden   Timing:  Constant   Progression:  Unchanged Chronicity:  New Dislocation: no   Prior injury to area:  No Relieved by:  Nothing Worsened by:  Activity, bearing weight, rotation and flexion Ineffective treatments:  Ice, acetaminophen and NSAIDs Associated symptoms: back pain   Associated symptoms: no decreased ROM, no fever, no neck pain, no numbness, no stiffness, no swelling and no tingling     Past Medical History  Diagnosis Date  . Irregular heartbeat   . Chronic back pain   . Chronic lumbar pain   . GERD (gastroesophageal reflux  disease)   . Rectal polyp    Past Surgical History  Procedure Laterality Date  . Laceration repair      repair to right arm that severed artery  . Arm surgery     No family history on file. History  Substance Use Topics  . Smoking status: Current Every Day Smoker    Types: Cigarettes  . Smokeless tobacco: Not on file  . Alcohol Use: No    Review of Systems  Constitutional: Negative for fever.  HENT: Negative for neck pain.   Respiratory: Negative for shortness of breath.   Cardiovascular: Negative for chest pain.  Gastrointestinal: Negative for vomiting, abdominal pain, constipation and bowel incontinence.  Genitourinary: Negative for bladder incontinence, dysuria, hematuria, flank pain, decreased urine volume, difficulty urinating and pelvic pain.       No perineal numbness or incontinence of urine or feces  Musculoskeletal: Positive for back pain and arthralgias. Negative for joint swelling and stiffness.  Skin: Negative for rash.  Neurological: Negative for dizziness, tingling, weakness, numbness and paresthesias.  All other systems reviewed and are negative.    Allergies  Codeine; Ibuprofen; and Ketorolac tromethamine  Home Medications   Current Outpatient Rx  Name  Route  Sig  Dispense  Refill  . acetaminophen (TYLENOL) 500 MG tablet   Oral   Take 1,000 mg by mouth every 6 (six) hours as needed for pain.          BP 121/86  Pulse 81  Temp(Src)  98 F (36.7 C) (Oral)  Resp 16  Ht 5\' 9"  (1.753 m)  Wt 240 lb (108.863 kg)  BMI 35.43 kg/m2  SpO2 99% Physical Exam  Nursing note and vitals reviewed. Constitutional: He is oriented to person, place, and time. He appears well-developed and well-nourished. No distress.  HENT:  Head: Normocephalic and atraumatic.  Neck: Normal range of motion. Neck supple.  Cardiovascular: Normal rate, regular rhythm, normal heart sounds and intact distal pulses.   No murmur heard. Pulmonary/Chest: Effort normal and breath  sounds normal. No respiratory distress.  Abdominal: Soft. He exhibits no distension. There is no tenderness.  Musculoskeletal: He exhibits tenderness. He exhibits no edema.       Lumbar back: He exhibits tenderness and pain. He exhibits normal range of motion, no swelling, no deformity, no laceration and normal pulse.  ttp of the lumbar spine and paraspinal muscles.  DP pulses are brisk and symmetrical.  Distal sensation intact.  Hip Flexors/Extensors are intact.  Pt also has ttp of the dorsal left foot.  Mild STS present.  DP pulse brisk, distal sensation intact.  CR< 2 sec.  Compartments are soft.  No tenderness proximally.  Neurological: He is alert and oriented to person, place, and time. He has normal strength. No sensory deficit. He exhibits normal muscle tone. Coordination and gait normal.  Reflex Scores:      Patellar reflexes are 2+ on the right side and 2+ on the left side.      Achilles reflexes are 2+ on the right side and 2+ on the left side. Skin: Skin is warm and dry. No rash noted.    ED Course  Procedures (including critical care time) Labs Review Labs Reviewed - No data to display Imaging Review No results found.  MDM    Previous ED chart and imaging results reviewed by me  Pt has hx of chronic low back pain that he believes has been aggravated by lifting heavy concrete blocks.  No concerning sx's today for emergent neurological or infectious process.  Pt states he has appt with Dr. Romeo Apple in 2 weeks which was the soonest available appt.  Left foot appears to be healing, no concerning sx's for compartment syndrome.  Madisson Kulaga L. Trisha Mangle, PA-C 10/02/12 [id number]

## 2012-09-30 NOTE — ED Notes (Addendum)
L foot and lumbar back pain since last weekend.  Was seen here for same, told to f/u w/Dr. Romeo Apple, but unable to get appointment for couple weeks.  No known mechanism of injury.  Rx for Percocet and has run out.

## 2012-10-02 NOTE — ED Provider Notes (Signed)
Medical screening examination/treatment/procedure(s) were performed by non-physician practitioner and as supervising physician I was immediately available for consultation/collaboration.    Vida Roller, MD 10/02/12 [phone]

## 2012-10-07 ENCOUNTER — Encounter (HOSPITAL_COMMUNITY): Payer: Self-pay | Admitting: *Deleted

## 2012-10-07 ENCOUNTER — Emergency Department (HOSPITAL_COMMUNITY)
Admission: EM | Admit: 2012-10-07 | Discharge: 2012-10-07 | Disposition: A | Discharge status: Home / Self Care | Payer: Self-pay | Attending: Emergency Medicine | Admitting: Emergency Medicine

## 2012-10-07 ENCOUNTER — Emergency Department (HOSPITAL_COMMUNITY): Payer: Self-pay

## 2012-10-07 DIAGNOSIS — Z860100 Personal history of colon polyps, unspecified: Secondary | ICD-10-CM | POA: Insufficient documentation

## 2012-10-07 DIAGNOSIS — W19XXXA Unspecified fall, initial encounter: Secondary | ICD-10-CM

## 2012-10-07 DIAGNOSIS — Y929 Unspecified place or not applicable: Secondary | ICD-10-CM | POA: Insufficient documentation

## 2012-10-07 DIAGNOSIS — F17200 Nicotine dependence, unspecified, uncomplicated: Secondary | ICD-10-CM | POA: Insufficient documentation

## 2012-10-07 DIAGNOSIS — Z8679 Personal history of other diseases of the circulatory system: Secondary | ICD-10-CM | POA: Insufficient documentation

## 2012-10-07 DIAGNOSIS — IMO0002 Reserved for concepts with insufficient information to code with codable children: Secondary | ICD-10-CM | POA: Insufficient documentation

## 2012-10-07 DIAGNOSIS — G8929 Other chronic pain: Secondary | ICD-10-CM | POA: Insufficient documentation

## 2012-10-07 DIAGNOSIS — Z79899 Other long term (current) drug therapy: Secondary | ICD-10-CM | POA: Insufficient documentation

## 2012-10-07 DIAGNOSIS — M79672 Pain in left foot: Secondary | ICD-10-CM

## 2012-10-07 DIAGNOSIS — Y9389 Activity, other specified: Secondary | ICD-10-CM | POA: Insufficient documentation

## 2012-10-07 DIAGNOSIS — Z8719 Personal history of other diseases of the digestive system: Secondary | ICD-10-CM | POA: Insufficient documentation

## 2012-10-07 DIAGNOSIS — M545 Low back pain, unspecified: Secondary | ICD-10-CM

## 2012-10-07 DIAGNOSIS — W11XXXA Fall on and from ladder, initial encounter: Secondary | ICD-10-CM | POA: Insufficient documentation

## 2012-10-07 DIAGNOSIS — S8990XA Unspecified injury of unspecified lower leg, initial encounter: Secondary | ICD-10-CM | POA: Insufficient documentation

## 2012-10-07 DIAGNOSIS — Z8739 Personal history of other diseases of the musculoskeletal system and connective tissue: Secondary | ICD-10-CM | POA: Insufficient documentation

## 2012-10-07 HISTORY — DX: Reserved for concepts with insufficient information to code with codable children: IMO0002

## 2012-10-07 MED ORDER — METHOCARBAMOL 500 MG PO TABS: 500.0000 mg | ORAL_TABLET | Freq: Two times a day (BID) | ORAL | Status: DC

## 2012-10-07 MED ORDER — OXYCODONE-ACETAMINOPHEN 5-325 MG PO TABS: 1.0000 | ORAL_TABLET | Freq: Three times a day (TID) | ORAL | Status: DC | PRN

## 2012-10-07 MED ORDER — OXYCODONE-ACETAMINOPHEN 5-325 MG PO TABS
2.0000 | ORAL_TABLET | Freq: Once | ORAL | Status: AC
  Administered 2012-10-07: 2 via ORAL
  Filled 2012-10-07: qty 2

## 2012-10-07 MED ORDER — DIAZEPAM 5 MG PO TABS
10.0000 mg | ORAL_TABLET | Freq: Once | ORAL | Status: AC
  Administered 2012-10-07: 10 mg via ORAL
  Filled 2012-10-07: qty 2

## 2012-10-07 MED ORDER — ALBUTEROL SULFATE (5 MG/ML) 0.5% IN NEBU
5.0000 mg | INHALATION_SOLUTION | Freq: Once | RESPIRATORY_TRACT | Status: AC
  Administered 2012-10-07: 5 mg via RESPIRATORY_TRACT
  Filled 2012-10-07: qty 1

## 2012-10-07 NOTE — ED Notes (Signed)
Pt in s/p fall yesterday afternoon, denies hitting head or LOC, c/o pain to mid to lower back and also pain to left foot, states he has been able to ambulate since fall, denies loss of bowel or bladder control

## 2012-10-07 NOTE — Progress Notes (Signed)
Orthopedic Tech Progress Note Patient Details:  YAZEED PRYER 1970/05/18 [id number] Called [employer] for brace order Patient ID: Arlee Muslim, male   DOB: 1970-06-02, 42 y.o.   MRN: [id number]   [name] 10/07/2012, 7:49 PM

## 2012-10-07 NOTE — ED Provider Notes (Signed)
CSN: [id number]     Arrival date & time 10/07/12  1654 History   This chart was scribed for non-physician practitioner Raymon Mutton, PA-C, working with Dione Booze, MD, by Yevette Edwards, ED Scribe. This patient was seen in room TR09C/TR09C and the patient's care was started at 7:00 PM. First MD Initiated Contact with Patient 10/07/12 1811     Chief Complaint  Patient presents with  . Fall    The history is provided by the patient. No language interpreter was used.   HPI Comments: John Clark is a 42 y.o. male, with a h/o chronic back pain, who presents to the Emergency Department complaining of a fall which occurred when he fell off the second slat of a ladder yesterday; he believes that he fell approximately five feet, landed on his back, and then tumbled down a hill. The pt reports he left foot became trapped under him as he rolled down the hill. He states that since the fall, he has experienced pain to his thoracic and lumbar back as well as to his left foot; he rates the pain as 9/10. The pt describes the pain to his back as non-radiating and "constant throbbing," and he states that pain to the thoracic region is worse than the lumbar back. He describes the pain to his left foot as "sharp," and he reports the pain radiates to his left ankle He also reports that movement and ambulation increase the pain. He attempted to mitigate the pain with ice, tylenol, and IBU with minimal relief. The pt denies any head trauma or LOC, inability to ambulate, bowel or bladder incontinence, chest pain, SOB, fever, nausea, emesis, numbness or tingling, blurred vision, headache, or lightheadedness. He was treated for similar symptoms in the ED approximately a week ago. The pt smokes cigarettes, but he has been using electronic cigarettes recently.   Past Medical History  Diagnosis Date  . Irregular heartbeat   . Chronic back pain   . Chronic lumbar pain   . GERD (gastroesophageal reflux disease)   .  Rectal polyp   . Degenerative disc disease    Past Surgical History  Procedure Laterality Date  . Laceration repair      repair to right arm that severed artery  . Arm surgery     History reviewed. No pertinent family history. History  Substance Use Topics  . Smoking status: Current Every Day Smoker    Types: Cigarettes  . Smokeless tobacco: Not on file  . Alcohol Use: No    Review of Systems  Constitutional: Negative for fever.  HENT: Negative for trouble swallowing and neck pain.   Eyes: Negative for visual disturbance.  Respiratory: Negative for shortness of breath.   Cardiovascular: Negative for chest pain.  Gastrointestinal: Negative for nausea and vomiting.  Genitourinary: Negative for urgency.  Musculoskeletal: Positive for myalgias and back pain.  Neurological: Negative for syncope, weakness, light-headedness, numbness and headaches.  All other systems reviewed and are negative.   Allergies  Codeine; Ibuprofen; and Ketorolac tromethamine  Home Medications   Current Outpatient Rx  Name  Route  Sig  Dispense  Refill  . acetaminophen (TYLENOL) 500 MG tablet   Oral   Take 1,000 mg by mouth every 6 (six) hours as needed for pain.         . cyclobenzaprine (FLEXERIL) 10 MG tablet   Oral   Take 1 tablet (10 mg total) by mouth 3 (three) times daily as needed.   21 tablet  0   . traMADol (ULTRAM) 50 MG tablet   Oral   Take 1 tablet (50 mg total) by mouth every 6 (six) hours as needed for pain.   20 tablet   0   . methocarbamol (ROBAXIN) 500 MG tablet   Oral   Take 1 tablet (500 mg total) by mouth 2 (two) times daily.   20 tablet   0   . oxyCODONE-acetaminophen (PERCOCET/ROXICET) 5-325 MG per tablet   Oral   Take 1 tablet by mouth every 8 (eight) hours as needed for pain.   9 tablet   0    Triage Vitals: BP 125/89  Pulse 88  Temp(Src) 98.4 F (36.9 C) (Oral)  Resp 22  SpO2 96%  Physical Exam  Nursing note and vitals  reviewed. Constitutional: He is oriented to person, place, and time. He appears well-developed and well-nourished. No distress.  HENT:  Head: Normocephalic and atraumatic.  Eyes: EOM are normal.  Neck: Normal range of motion. Neck supple.  Negative neck stiffness Negative nuchal rigidity Negative pain upon palpation to the cervical spine  Cardiovascular: Normal rate.   Pulmonary/Chest: Effort normal. No respiratory distress. He has wheezes in the right upper field, the right middle field, the right lower field, the left upper field and the left lower field. He has no rales.  Diffuse inspiratory wheezing to upper and lower lobes bilaterally  Musculoskeletal: Normal range of motion. He exhibits tenderness.       Back:       Feet:  Mild hematoma noted to the T5 thoracic region, pain upon palpation to this region. Pain upon palpation to the mid thoracic mid spinal region all the way down to the lumbar, coccyx region. Decreased flexion of back secondary to pain Full range of motion to upper lower extremities bilaterally  Mild swelling noted to the left foot and left ankle. Negative signs of ecchymosis, erythema, deformities. Bone spur to dorsal aspect of the left foot, metatarsal region. Mild discomfort upon palpation to the dorsal aspect of the left foot. Patient able to flex and extend toes of the left foot. Strength intact to toes. Full range of motion to left ankle without discomfort identified.  Neurological: He is alert and oriented to person, place, and time. No cranial nerve deficit. He exhibits normal muscle tone. Coordination normal.  Cranial nerves III through XII grossly intact Strength 5+/5+ to upper and lower extremities bilaterally with resistance, equal distribution Sensation intact to upper and lower tremors bilaterally with differentiation to sharp and dull touch Straight leg raise identified with mild discomfort on the left side more so than the right  Skin: Skin is warm and  dry.  Psychiatric: He has a normal mood and affect. His behavior is normal.    ED Course  Procedures (including critical care time)  DIAGNOSTIC STUDIES: Oxygen Saturation is 96% on room air, normal by my interpretation.    COORDINATION OF CARE:  7:10 PM- Discussed treatment plan with patient which includes pain medication, a back brace, and a follow-up with an orthopedist, and the patient agreed to the plan.   Labs Review Labs Reviewed - No data to display Imaging Review Dg Thoracic Spine 2 View  10/07/2012   *RADIOLOGY REPORT*  Clinical Data: Larey Seat 6 feet from ladder then rolled down a hill, upper and lower back pain  THORACIC SPINE - 2 VIEW  Comparison: 08/26/2012  Findings: 12 pairs of ribs. Osseous mineralization normal. Multilevel disc space narrowing and endplate spur formation. Vertebral body  heights maintained without fracture or subluxation. Visualized portions of the posterior ribs appear intact.  IMPRESSION: Degenerative disc disease changes scattered throughout mid to inferior thoracic spine. No definite acute bony abnormalities.   Original Report Authenticated By: Ulyses Southward, M.D.   Dg Lumbar Spine Complete  10/07/2012   *RADIOLOGY REPORT*  Clinical Data: Larey Seat 6 feet from a ladder then rolled down a hill, upper and lower back pain  LUMBAR SPINE - COMPLETE 4+ VIEW  Comparison: 08/26/2012  Findings: Five non-rib bearing lumbar vertebrae. Vertebral body and disc space heights maintained. No acute fracture, subluxation or bone destruction. No spondylolysis. SI joints symmetric.  IMPRESSION: No acute lumbar spine abnormalities.   Original Report Authenticated By: Ulyses Southward, M.D.   Dg Foot Complete Left  10/07/2012   *RADIOLOGY REPORT*  Clinical Data: Fall from ladder.  The left dorsal foot pain  LEFT FOOT - COMPLETE 3+ VIEW  Comparison: None.  Findings: No evidence of fracture of the calcaneus.  Bowler's angle is intact.  No evidence of fracture of the midfoot or forefoot. There is  degenerative spurring at the tarsal metatarsal joints.  IMPRESSION:  1.  No evidence of fracture of the left foot.  2.  Degenerative change at the tarsometatarsal joints   Original Report Authenticated By: Genevive Bi, M.D.    MDM   1. Lumbar back pain   2. Left foot pain   3. Fall, initial encounter    I personally performed the services described in this documentation, which was scribed in my presence. The recorded information has been reviewed and is accurate.  Patient presenting to emergency department with back pain and left foot pain after patient fell off a ladder last night while putting up 10, reported that he fell approximately 5 feet and landed directly on his back. Patient proceeds been having mid thoracic, lumbar pain that is described as a sharp shooting pain without radiation. Reported that when he fell he tumbled and rolled down a hill, reported that his left foot I talked underneath during this process. Discussed left foot pain described as a throbbing sensation with mild radiation to the ankle. Patient reports she's noticed swelling to the left foot. Denied head injury, loss of consciousness, dizziness, nausea, vomiting, numbness, tingling, urinary or bowel incontinence. Alert and oriented. Full range of motion to upper lower extremities bilaterally. Negative neck stiffness, negative neck pain, negative pain upon palpation to cervical spine. Discomfort upon palpation to the mid spinal region of the midthoracic all the way down to the lumbar, coccyx region. Decreased flexion of the back secondary to pain. Straight leg raise identified a mild discomfort noted to the left side. Hematoma noted at T5. Discomfort upon palpation to the dorsal aspect of the left foot, bone spur identified at the dorsal aspect of the metatarsal. Strength intact to toes of left foot. Full range of motion to the left ankle without discomfort. Sensation intact. Strength intact. Pulses palpable. Negative  neurological deficits identified. Left foot noted degenerative change at the tarsal metal tarsal joints, degenerative bone spurring. Thoracic spine noted degenerative disc disease throughout the neck or thoracic spine with no acute abnormalities. Lumbar plain film negative acute abnormalities identified. Inspiratory wheezes identified upon physical exam to upper lower lobes bilaterally-albuterol treatment given to patient. Pain controlled in ED setting. Patient stable, afebrile. Bone contusion of thoracic and lumbar region identified and patient secondary to fall. Doubt cauda equina syndrome. Patient placed in to TLSO brace for comfort. Patient placed in postop boot for comfort.  Discharged patient with small dose of pain medications and muscle axis. Referred patient to orthopedics for followup as outpatient. Discussed with patient to rest and stay hydrated. Discussed with patient to refrain from any physical activity or strenuous activity. Discussed with patient to apply heat to back to [facility] discomfort relief. Discussed with patient to continue monitor symptoms and if symptoms are to worsen or change report back to emergency department-strict return instructions given. Patient agreed to plan of care, understood, all questions answered.  Raymon Mutton, PA-C 10/08/12 0211

## 2012-10-07 NOTE — ED Notes (Signed)
Ortho Tech at bedside to apply TLSO brace at this time

## 2012-10-07 NOTE — Progress Notes (Signed)
Orthopedic Tech Progress Note Patient Details:  John Clark March 27, 1970 [id number] Brace order completed by bio-tech vendor. Patient ID: John Clark, male   DOB: 21-Jul-1970, 42 y.o.   MRN: [id number]   [name] 10/07/2012, 10:02 PM

## 2012-10-08 NOTE — ED Provider Notes (Signed)
Medical screening examination/treatment/procedure(s) were performed by non-physician practitioner and as supervising physician I was immediately available for consultation/collaboration.  Dione Booze, MD 10/08/12 1505

## 2012-10-31 ENCOUNTER — Emergency Department (HOSPITAL_COMMUNITY)
Admission: EM | Admit: 2012-10-31 | Discharge: 2012-10-31 | Disposition: A | Discharge status: Home / Self Care | Payer: Self-pay | Attending: Emergency Medicine | Admitting: Emergency Medicine

## 2012-10-31 ENCOUNTER — Encounter (HOSPITAL_COMMUNITY): Payer: Self-pay | Admitting: Emergency Medicine

## 2012-10-31 ENCOUNTER — Emergency Department (HOSPITAL_COMMUNITY): Payer: Self-pay

## 2012-10-31 DIAGNOSIS — F17200 Nicotine dependence, unspecified, uncomplicated: Secondary | ICD-10-CM | POA: Insufficient documentation

## 2012-10-31 DIAGNOSIS — Z8719 Personal history of other diseases of the digestive system: Secondary | ICD-10-CM | POA: Insufficient documentation

## 2012-10-31 DIAGNOSIS — W230XXA Caught, crushed, jammed, or pinched between moving objects, initial encounter: Secondary | ICD-10-CM | POA: Insufficient documentation

## 2012-10-31 DIAGNOSIS — Z8739 Personal history of other diseases of the musculoskeletal system and connective tissue: Secondary | ICD-10-CM | POA: Insufficient documentation

## 2012-10-31 DIAGNOSIS — Y929 Unspecified place or not applicable: Secondary | ICD-10-CM | POA: Insufficient documentation

## 2012-10-31 DIAGNOSIS — Y9389 Activity, other specified: Secondary | ICD-10-CM | POA: Insufficient documentation

## 2012-10-31 DIAGNOSIS — G8929 Other chronic pain: Secondary | ICD-10-CM | POA: Insufficient documentation

## 2012-10-31 DIAGNOSIS — S59909A Unspecified injury of unspecified elbow, initial encounter: Secondary | ICD-10-CM | POA: Insufficient documentation

## 2012-10-31 DIAGNOSIS — S6990XA Unspecified injury of unspecified wrist, hand and finger(s), initial encounter: Secondary | ICD-10-CM | POA: Insufficient documentation

## 2012-10-31 MED ORDER — OXYCODONE-ACETAMINOPHEN 5-325 MG PO TABS: 1.0000 | ORAL_TABLET | Freq: Three times a day (TID) | ORAL | Status: DC | PRN

## 2012-10-31 NOTE — ED Provider Notes (Signed)
CSN: [id number]     Arrival date & time 10/31/12  1825 History   First MD Initiated Contact with Patient 10/31/12 2018     Chief Complaint  Patient presents with  . Arm Injury    right   (Consider location/radiation/quality/duration/timing/severity/associated sxs/prior Treatment) Patient is a 42 y.o. male presenting with arm injury. The history is provided by the patient. No language interpreter was used.  Arm Injury Location:  Elbow and shoulder Time since incident:  2 hours Injury: yes   Mechanism of injury comment:  Patient's arm got caught in an auger Shoulder location:  R shoulder Elbow location:  R elbow Pain details:    Quality:  Aching   Radiates to:  Does not radiate   Severity:  Severe   Onset quality:  Sudden Chronicity:  New Handedness:  Right-handed Dislocation: no   Relieved by:  Nothing    Past Medical History  Diagnosis Date  . Irregular heartbeat   . Chronic back pain   . Chronic lumbar pain   . GERD (gastroesophageal reflux disease)   . Rectal polyp   . Degenerative disc disease    Past Surgical History  Procedure Laterality Date  . Laceration repair      repair to right arm that severed artery  . Arm surgery     No family history on file. History  Substance Use Topics  . Smoking status: Current Every Day Smoker    Types: Cigarettes  . Smokeless tobacco: Not on file  . Alcohol Use: No    Review of Systems  Genitourinary: Negative for dysuria.  Musculoskeletal: Positive for myalgias and arthralgias. Negative for joint swelling.  Skin: Negative for wound.  Neurological: Negative for numbness.    Allergies  Codeine; Ibuprofen; and Ketorolac tromethamine  Home Medications   Current Outpatient Rx  Name  Route  Sig  Dispense  Refill  . acetaminophen (TYLENOL) 500 MG tablet   Oral   Take 1,000 mg by mouth every 6 (six) hours as needed for pain.         Marland Kitchen oxyCODONE-acetaminophen (PERCOCET/ROXICET) 5-325 MG per tablet   Oral   Take 1  tablet by mouth every 8 (eight) hours as needed for pain.   9 tablet   0   . traMADol (ULTRAM) 50 MG tablet   Oral   Take 1 tablet (50 mg total) by mouth every 6 (six) hours as needed for pain.   20 tablet   0    BP 124/66  Pulse 72  Temp(Src) 98.3 F (36.8 C) (Oral)  Resp 20  SpO2 98% Physical Exam  Nursing note and vitals reviewed. Constitutional: He is oriented to person, place, and time. He appears well-developed and well-nourished. No distress.  HENT:  Head: Normocephalic and atraumatic.  Eyes: Conjunctivae are normal. No scleral icterus.  Neck: Normal range of motion. Neck supple.  Cardiovascular: Normal rate, regular rhythm and normal heart sounds.   Pulmonary/Chest: Effort normal and breath sounds normal. No respiratory distress.  Abdominal: Soft. There is no tenderness.  Musculoskeletal:  Left elbow and shoulder with decreased ROM. No overt deformity, wounds or ecchymosis  Neurological: He is alert and oriented to person, place, and time. He has normal reflexes.  Skin: Skin is warm and dry. He is not diaphoretic.     Psychiatric: His behavior is normal.    ED Course  Procedures (including critical care time) Labs Review Labs Reviewed - No data to display Imaging Review No results found.  DG Shoulder Right (Final result)  Result time: 10/31/12 20:16:53    Final result by Rad Results In Interface (10/31/12 20:16:53)    Narrative:   CLINICAL DATA: Injured right shoulder.  EXAM: RIGHT SHOULDER - 2+ VIEW  COMPARISON: None.  FINDINGS: The joint spaces are maintained. No acute fracture or abnormal soft tissue calcifications. The right lung is clear.  IMPRESSION: No fracture or dislocation.   Electronically Signed By: Loralie Champagne M.D. On: 10/31/2012 20:16             DG Forearm Right (Final result)  Result time: 10/31/12 20:20:03    Final result by Rad Results In Interface (10/31/12 20:20:03)    Narrative:   CLINICAL DATA: Injured right  forearm.  EXAM: RIGHT FOREARM - 2 VIEW  COMPARISON: None  FINDINGS: The wrist and elbow joints are maintained. No acute forearm fracture. Small staples are noted in the proximal forearm.  IMPRESSION: No acute bony findings.   Electronically Signed By: Loralie Champagne M.D. On: 10/31/2012 20:20     MDM   1. Arm injury, initial encounter    BP 124/66  Pulse 72  Temp(Src) 98.3 F (36.8 C) (Oral)  Resp 20  SpO2 98% The patient images without acute fracture or dislocation. I have personally reviewed the images using our PACS system. Patient will be discharged home with sling, pain medication. Pulses intact and no signs of compartment syndrome. Follow up with orthopedics.    Arthor Captain, PA-C 11/04/12 1624

## 2012-10-31 NOTE — ED Notes (Signed)
Pt was putting up fence post and arm got twisted inward. Pt c/o right arm pain from elbow downward. Cannot move arm.

## 2012-10-31 NOTE — ED Notes (Signed)
Pt ambulatory to exam room with steady gait.  

## 2012-11-04 NOTE — ED Provider Notes (Signed)
Medical screening examination/treatment/procedure(s) were performed by non-physician practitioner and as supervising physician I was immediately available for consultation/collaboration.  Janann Boeve F Abbee Cremeens, MD 11/04/12 1857 

## 2013-03-22 ENCOUNTER — Emergency Department (HOSPITAL_COMMUNITY)
Admission: EM | Admit: 2013-03-22 | Discharge: 2013-03-22 | Disposition: A | Discharge status: Home / Self Care | Payer: Self-pay | Attending: Emergency Medicine | Admitting: Emergency Medicine

## 2013-03-22 ENCOUNTER — Emergency Department (HOSPITAL_COMMUNITY): Payer: Self-pay

## 2013-03-22 ENCOUNTER — Encounter (HOSPITAL_COMMUNITY): Payer: Self-pay | Admitting: Emergency Medicine

## 2013-03-22 DIAGNOSIS — W010XXA Fall on same level from slipping, tripping and stumbling without subsequent striking against object, initial encounter: Secondary | ICD-10-CM | POA: Insufficient documentation

## 2013-03-22 DIAGNOSIS — Z23 Encounter for immunization: Secondary | ICD-10-CM | POA: Insufficient documentation

## 2013-03-22 DIAGNOSIS — IMO0002 Reserved for concepts with insufficient information to code with codable children: Secondary | ICD-10-CM | POA: Insufficient documentation

## 2013-03-22 DIAGNOSIS — F17200 Nicotine dependence, unspecified, uncomplicated: Secondary | ICD-10-CM | POA: Insufficient documentation

## 2013-03-22 DIAGNOSIS — S60222A Contusion of left hand, initial encounter: Secondary | ICD-10-CM

## 2013-03-22 DIAGNOSIS — M545 Low back pain, unspecified: Secondary | ICD-10-CM | POA: Insufficient documentation

## 2013-03-22 DIAGNOSIS — S60229A Contusion of unspecified hand, initial encounter: Secondary | ICD-10-CM | POA: Insufficient documentation

## 2013-03-22 DIAGNOSIS — Z8719 Personal history of other diseases of the digestive system: Secondary | ICD-10-CM | POA: Insufficient documentation

## 2013-03-22 DIAGNOSIS — S63509A Unspecified sprain of unspecified wrist, initial encounter: Secondary | ICD-10-CM | POA: Insufficient documentation

## 2013-03-22 DIAGNOSIS — G8929 Other chronic pain: Secondary | ICD-10-CM | POA: Insufficient documentation

## 2013-03-22 DIAGNOSIS — Y939 Activity, unspecified: Secondary | ICD-10-CM | POA: Insufficient documentation

## 2013-03-22 DIAGNOSIS — R002 Palpitations: Secondary | ICD-10-CM | POA: Insufficient documentation

## 2013-03-22 DIAGNOSIS — Z9889 Other specified postprocedural states: Secondary | ICD-10-CM | POA: Insufficient documentation

## 2013-03-22 DIAGNOSIS — Y9289 Other specified places as the place of occurrence of the external cause: Secondary | ICD-10-CM | POA: Insufficient documentation

## 2013-03-22 MED ORDER — PREDNISONE 50 MG PO TABS
ORAL_TABLET | ORAL | Status: DC
Start: 2013-03-22 — End: 2013-03-23
  Filled 2013-03-22: qty 1

## 2013-03-22 MED ORDER — DEXAMETHASONE 4 MG PO TABS: ORAL_TABLET | ORAL | Status: DC

## 2013-03-22 MED ORDER — PREDNISONE 10 MG PO TABS
ORAL_TABLET | ORAL | Status: AC
  Filled 2013-03-22: qty 1

## 2013-03-22 MED ORDER — ACETAMINOPHEN 500 MG PO TABS
1000.0000 mg | ORAL_TABLET | Freq: Once | ORAL | Status: AC
  Administered 2013-03-22: 1000 mg via ORAL
  Filled 2013-03-22: qty 2

## 2013-03-22 MED ORDER — PREDNISONE 50 MG PO TABS
60.0000 mg | ORAL_TABLET | Freq: Once | ORAL | Status: AC
  Administered 2013-03-22: 60 mg via ORAL

## 2013-03-22 NOTE — Discharge Instructions (Signed)
The x-rays of your hand and wrist are negative for fracture or dislocation. Suspected you have a bad sprain. Please use the splint for the next 7-10 days. Please use Tylenol extra strength every 4 hours as needed for discomfort. Please apply ice tonight and tomorrow. Please see the orthopedist listed above if not improving.

## 2013-03-22 NOTE — ED Notes (Signed)
Pt reports he slipped on ice this afternoon and fell with all his weight on his L hand. C/O pain in the wrist and thumb.

## 2013-03-22 NOTE — ED Provider Notes (Signed)
CSN: [id number]     Arrival date & time 03/22/13  2110 History   First MD Initiated Contact with Patient 03/22/13 2137     Chief Complaint  Patient presents with  . Wrist Pain  . Hand Pain     (Consider location/radiation/quality/duration/timing/severity/associated sxs/prior Treatment) HPI Comments: Patient is a 43 year old male who presents to the emergency department with left hand and wrist pain. The patient states that this afternoon he stepped off of his step and when his foot landed he slipped and injured his left hand and wrist. He notes some swelling and increasing pain of this area. The patient has not had any previous operations or procedures involving the hand. He is not on any blood thinning type medications. He does not have any history of bleeding disorder. He has not taken any medication for this problem up to this point.  Patient is a 43 y.o. male presenting with wrist pain and hand pain. The history is provided by the patient.  Wrist Pain Pertinent negatives include no abdominal pain, arthralgias, chest pain, coughing or neck pain.  Hand Pain Pertinent negatives include no abdominal pain, arthralgias, chest pain, coughing or neck pain.    Past Medical History  Diagnosis Date  . Irregular heartbeat   . Chronic back pain   . Chronic lumbar pain   . GERD (gastroesophageal reflux disease)   . Rectal polyp   . Degenerative disc disease    Past Surgical History  Procedure Laterality Date  . Laceration repair      repair to right arm that severed artery  . Arm surgery    . Groin exploration     Family History  Problem Relation Age of Onset  . Alzheimer's disease Other    History  Substance Use Topics  . Smoking status: Current Every Day Smoker -- 1.00 packs/day    Types: Cigarettes  . Smokeless tobacco: Never Used  . Alcohol Use: No    Review of Systems  Constitutional: Negative for activity change.       All ROS Neg except as noted in HPI  HENT: Negative  for nosebleeds.   Eyes: Negative for photophobia and discharge.  Respiratory: Negative for cough, shortness of breath and wheezing.   Cardiovascular: Positive for palpitations. Negative for chest pain.  Gastrointestinal: Negative for abdominal pain and blood in stool.  Genitourinary: Negative for dysuria, frequency and hematuria.  Musculoskeletal: Positive for back pain. Negative for arthralgias and neck pain.  Skin: Negative.   Neurological: Negative for dizziness, seizures and speech difficulty.  Psychiatric/Behavioral: Negative for hallucinations and confusion.      Allergies  Codeine; Ibuprofen; and Ketorolac tromethamine  Home Medications   Current Outpatient Rx  Name  Route  Sig  Dispense  Refill  . acetaminophen (TYLENOL) 500 MG tablet   Oral   Take 1,000 mg by mouth every 6 (six) hours as needed for pain.         Marland Kitchen oxyCODONE-acetaminophen (PERCOCET/ROXICET) 5-325 MG per tablet   Oral   Take 1 tablet by mouth every 8 (eight) hours as needed for pain.   10 tablet   0   . traMADol (ULTRAM) 50 MG tablet   Oral   Take 1 tablet (50 mg total) by mouth every 6 (six) hours as needed for pain.   20 tablet   0    BP 148/115  Pulse 88  Temp(Src) 98.2 F (36.8 C) (Oral)  Resp 14  Ht 5\' 10"  (1.778 m)  Wt  235 lb (106.595 kg)  BMI 33.72 kg/m2  SpO2 99% Physical Exam  Nursing note and vitals reviewed. Constitutional: He is oriented to person, place, and time. He appears well-developed and well-nourished.  Non-toxic appearance.  HENT:  Head: Normocephalic.  Right Ear: Tympanic membrane and external ear normal.  Left Ear: Tympanic membrane and external ear normal.  Eyes: EOM and lids are normal. Pupils are equal, round, and reactive to light.  Neck: Normal range of motion. Neck supple. Carotid bruit is not present.  Cardiovascular: Normal rate, regular rhythm, normal heart sounds, intact distal pulses and normal pulses.   Pulmonary/Chest: Breath sounds normal. No  respiratory distress.  Abdominal: Soft. Bowel sounds are normal. There is no tenderness. There is no guarding.  Musculoskeletal: Normal range of motion.  There is full range of motion of the left shoulder and elbow. There is pain with range of motion of the left wrist. There is pain in the anatomical snuff box on the left. There is pain at the metacarpal phalangeal joint of the first finger of the left hand. There is some bruising at the MCP of the left first finger, and extending into the thenar eminence. Good range of motion of both hips, knees, ankles.  Lymphadenopathy:       Head (right side): No submandibular adenopathy present.       Head (left side): No submandibular adenopathy present.    He has no cervical adenopathy.  Neurological: He is alert and oriented to person, place, and time. He has normal strength. No cranial nerve deficit or sensory deficit.  Skin: Skin is warm and dry.  Psychiatric: He has a normal mood and affect. His speech is normal.    ED Course  Procedures (including critical care time) Labs Review Labs Reviewed - No data to display Imaging Review No results found.  EKG Interpretation   None       MDM   Final diagnoses:  None    *I have reviewed nursing notes, vital signs, and all appropriate lab and imaging results for this patient.**  X-ray of the left hand and wrist are negative for fracture or dislocation. The patient was fitted with a thumb spica splint. The patient has multiple allergies to medications, patient has been asked to use Tylenol Extra Strength for his discomfort. The patient requested to use a steroid along with this. Prescription for dexamethasone 4 mg twice a day given to the patient. Patient to follow up with Dr. Hilda LiasKeeling if any additional changes or problems.  Kathie DikeHobson M Takyra Cantrall, PA-C 03/22/13 2258

## 2013-03-23 NOTE — ED Provider Notes (Signed)
Medical screening examination/treatment/procedure(s) were performed by non-physician practitioner and as supervising physician I was immediately available for consultation/collaboration.  EKG Interpretation   None        Ercel Normoyle R. Enrico Eaddy, MD 03/23/13 0006 

## 2013-04-30 ENCOUNTER — Emergency Department (HOSPITAL_COMMUNITY): Payer: Self-pay

## 2013-04-30 ENCOUNTER — Encounter (HOSPITAL_COMMUNITY): Payer: Self-pay | Admitting: Emergency Medicine

## 2013-04-30 ENCOUNTER — Emergency Department (HOSPITAL_COMMUNITY)
Admission: EM | Admit: 2013-04-30 | Discharge: 2013-04-30 | Disposition: A | Discharge status: Home / Self Care | Payer: Self-pay | Attending: Emergency Medicine | Admitting: Emergency Medicine

## 2013-04-30 DIAGNOSIS — X500XXA Overexertion from strenuous movement or load, initial encounter: Secondary | ICD-10-CM | POA: Insufficient documentation

## 2013-04-30 DIAGNOSIS — G8929 Other chronic pain: Secondary | ICD-10-CM | POA: Insufficient documentation

## 2013-04-30 DIAGNOSIS — F17200 Nicotine dependence, unspecified, uncomplicated: Secondary | ICD-10-CM | POA: Insufficient documentation

## 2013-04-30 DIAGNOSIS — Z8739 Personal history of other diseases of the musculoskeletal system and connective tissue: Secondary | ICD-10-CM | POA: Insufficient documentation

## 2013-04-30 DIAGNOSIS — S62173A Displaced fracture of trapezium [larger multangular], unspecified wrist, initial encounter for closed fracture: Secondary | ICD-10-CM | POA: Insufficient documentation

## 2013-04-30 DIAGNOSIS — Y929 Unspecified place or not applicable: Secondary | ICD-10-CM | POA: Insufficient documentation

## 2013-04-30 DIAGNOSIS — Y9389 Activity, other specified: Secondary | ICD-10-CM | POA: Insufficient documentation

## 2013-04-30 DIAGNOSIS — Z8719 Personal history of other diseases of the digestive system: Secondary | ICD-10-CM | POA: Insufficient documentation

## 2013-04-30 MED ORDER — ALBUTEROL SULFATE HFA 108 (90 BASE) MCG/ACT IN AERS
2.0000 | INHALATION_SPRAY | Freq: Once | RESPIRATORY_TRACT | Status: AC
  Administered 2013-04-30: 2 via RESPIRATORY_TRACT
  Filled 2013-04-30: qty 6.7

## 2013-04-30 MED ORDER — OXYCODONE-ACETAMINOPHEN 5-325 MG PO TABS
2.0000 | ORAL_TABLET | Freq: Four times a day (QID) | ORAL | Status: DC | PRN
Start: 2013-04-30 — End: 2013-07-15

## 2013-04-30 NOTE — ED Provider Notes (Signed)
CSN: [id number]     Arrival date & time 04/30/13  1853 History  This chart was scribed for non-physician practitioner, Irish EldersKelly Torence Palmeri, FNP,working with Dagmar HaitWilliam Blair Walden, MD, by Karle PlumberJennifer Tensley, ED Scribe.  This patient was seen in room TR07C/TR07C and the patient's care was started at 8:13 PM.  Chief Complaint  Patient presents with  . Arm Injury   HPI HPI Comments:  John PettiesDaryl D Clark is a 43 y.o. male who presents to the Emergency Department complaining of a right arm injury secondary to drilling into a metal bar causing his wrist to twist earlier today. He reports moderate right arm pain and left thumb pain. He states his tetanus vaccination is UTD. He denies numbness or tingling in the fingers. He states he smokes cigarettes daily.   Past Medical History  Diagnosis Date  . Irregular heartbeat   . Chronic back pain   . Chronic lumbar pain   . GERD (gastroesophageal reflux disease)   . Rectal polyp   . Degenerative disc disease    Past Surgical History  Procedure Laterality Date  . Laceration repair      repair to right arm that severed artery  . Arm surgery    . Groin exploration     Family History  Problem Relation Age of Onset  . Alzheimer's disease Other    History  Substance Use Topics  . Smoking status: Current Every Day Smoker -- 1.00 packs/day    Types: Cigarettes  . Smokeless tobacco: Never Used  . Alcohol Use: No    Review of Systems  Musculoskeletal: Positive for arthralgias and myalgias. Negative for back pain, gait problem, joint swelling, neck pain and neck stiffness.  Skin: Negative for rash.  Neurological: Negative for weakness and numbness.  All other systems reviewed and are negative.    Allergies  Codeine; Ibuprofen; and Ketorolac tromethamine  Home Medications  No current outpatient prescriptions on file. Triage Vitals: BP 124/86  Pulse 85  Resp 16  Ht 5\' 9"  (1.753 m)  Wt 240 lb 5 oz (109.005 kg)  BMI 35.47 kg/m2  SpO2 96% Physical Exam   Nursing note and vitals reviewed. Constitutional: He is oriented to person, place, and time. He appears well-developed and well-nourished.  HENT:  Head: Normocephalic and atraumatic.  Eyes: Conjunctivae and EOM are normal.  Neck: Normal range of motion. Neck supple.  Cardiovascular: Normal rate, regular rhythm and normal heart sounds.   Musculoskeletal:  Limited ROM, right arm and wrist. Good capillary refill and distal pulses. No swelling or obvious deformity noted.  Left thumb with mild swelling, limited ROM, patient reports pain with flexion. No numbness or tingling.  Neurological: He is alert and oriented to person, place, and time.  Skin: Skin is warm and dry.  Psychiatric: He has a normal mood and affect. His behavior is normal. Judgment and thought content normal.    ED Course  Procedures (including critical care time) DIAGNOSTIC STUDIES: Oxygen Saturation is 96% on RA, normal by my interpretation.   COORDINATION OF CARE: 8:16 PM- Will splint right arm and prescribe pain medication. Will prescribe an albuterol MDI secondary to wheezing noted upon examination. Pt verbalizes understanding and agrees to plan.  Medications  albuterol (PROVENTIL HFA;VENTOLIN HFA) 108 (90 BASE) MCG/ACT inhaler 2 puff (not administered)    Labs Review Labs Reviewed - No data to display Imaging Review Dg Forearm Right  04/30/2013   CLINICAL DATA:  Pain post trauma  EXAM: RIGHT FOREARM - 2 VIEW  COMPARISON:  March 18, 2012  FINDINGS: Frontal and lateral views were obtained. There is no fracture or dislocation. Joint spaces appear intact. No abnormal periosteal reaction. There are surgical clips medially and volar to the proximal ulna.  IMPRESSION: No fracture or dislocation.   Electronically Signed   By: Bretta Bang M.D.   On: 04/30/2013 20:08   Dg Wrist Complete Right  04/30/2013   CLINICAL DATA:  Pain post trauma  EXAM: RIGHT WRIST - COMPLETE 3+ VIEW  COMPARISON:  April 17, 2012   FINDINGS: Frontal, oblique, lateral, and ulnar deviation scaphoid images were obtained. There is a new tiny focus of calcification in the lateral aspect of the saddle joint. A small avulsion arising from the lateral, distal aspect of the trapezium is questioned. There is no other evidence of potential acute fracture. No dislocation. There are benign appearing cystic areas in the capitate bone. No erosive change.  IMPRESSION: Suspect small avulsion arising from the distal lateral trapezium. Benign cystic change in the capitate bone, stable. No dislocation.   Electronically Signed   By: Bretta Bang M.D.   On: 04/30/2013 20:00   Dg Finger Thumb Left  04/30/2013   CLINICAL DATA:  Left thumb pain.  EXAM: LEFT THUMB 2+V  COMPARISON:  03/22/2013  FINDINGS: There are old avulsions from the radial and ulnar aspects of the base of the proximal phalanx of the thumb. The medial avulsion could be associated with an ulnar collateral ligament disruption.  There are slight degenerative changes at the first carpal metacarpal joint.  No acute fractures.  IMPRESSION: Old avulsions from the radial and ulnar aspect of the base of the proximal phalanx of the left thumb. The possibility of ulnar collateral ligament avulsion should be considered.   Electronically Signed   By: Geanie Cooley M.D.   On: 04/30/2013 20:17     EKG Interpretation None      MDM   Final diagnoses:  Fracture of trapezium of wrist   Right forearm negative for fracture or dislocation. Left thumb; questionable collateral ligament avulsion. Right wrist x-ray; possible small avulsion of trapezium. Stable, no dislocation. Patient has good sensation, coordination, distal pulses and brisk capillary refill in the upper extremities.  Patient placed in the left thumb spica, Velcro splint. Patient also placed in right wrist splint. Prescription for hydrocodone given. Follow up with orthopedist.   I personally performed the services described in this  documentation, which was scribed in my presence. The recorded information has been reviewed and is accurate.    [name], NP 05/02/13 [id number]

## 2013-04-30 NOTE — Discharge Instructions (Signed)
Wrist Fracture A wrist fracture is a break in one of the bones of the wrist. Your wrist is made up of several small bones at the palm of your hand (carpal bones) and the two bones that make up your forearm (radius and ulna). The bones come together to form multiple large and small joints. The shape and design of these joints allow your wrist to bend and straighten, move side-to-side, and rotate, as in twisting your palm up or down. CAUSES  A fracture may occur in any of the bones in your wrist when enough force is applied to the wrist, such as when falling down onto an outstretched hand. Severe injuries may occur from a more forceful injury. SYMPTOMS Symptoms of wrist fractures include tenderness, bruising, and swelling. Additionally, the wrist may hang in an odd position or may be misshaped. DIAGNOSIS To diagnose a wrist fracture, your caregiver will physically examine your wrist. Your caregiver may also request an X-ray exam of your wrist. TREATMENT Treatment depends on many factors, including the nature and location of the fracture, your age, and your activity level. Treatment for wrist fracture can be nonsurgical or surgical. For nonsurgical treatment, a plaster cast or splint may be applied to your wrist if the bone is in a good position (aligned). The cast will stay on for about 6 weeks. If the alignment of your bone is not good, it may be necessary to realign (reduce) it. After the bone is reduced, a splint usually is placed on your wrist to allow for a small amount of normal swelling. After about 1 week, the splint is removed and a cast is added. The cast is removed 2 or 3 weeks later, after the swelling goes down, causing the cast to loosen. Another cast is applied. This cast is removed after about another 2 or 3 weeks, for a total of 4 to 6 weeks of immobilization. Sometimes the position of the bone is so far out of place that surgery is required to apply a device to hold it together as it  heals. If the bone cannot be reduced without cutting the skin around the bone (closed reduction), a cut (incision) is made to allow direct access to the bone to reduce it (open reduction). Depending on the fracture, there are a number of options for holding the bone in place while it heals, including a cast, metal pins, a plate and screws, and a device called an external fixator. With an external fixator, most of the hardware remains outside of the body. HOME CARE INSTRUCTIONS  To lessen swelling, keep your injured wrist elevated and move your fingers as much as possible.  Apply ice to your wrist for the first 1 to 2 days after you have been treated or as directed by your caregiver. Applying ice helps to reduce inflammation and pain.  Put ice in a plastic bag.  Place a towel between your skin and the bag.  Leave the ice on for 15 to 20 minutes at a time, every 2 hours while you are awake.  Do not put pressure on any part of your cast or splint. It may break.  Use a plastic bag to protect your cast or splint from water while bathing or showering. Do not lower your cast or splint into water.  Only take over-the-counter or prescription medicines for pain as directed by your caregiver. SEEK IMMEDIATE MEDICAL CARE IF:   Your cast or splint gets damaged or breaks.  You have continued severe pain  or more swelling than you did before the cast was put on.  Your skin or fingernails below the injury turn blue or gray or feel cold or numb.  You develop decreased feeling in your fingers. MAKE SURE YOU:  Understand these instructions.  Will watch your condition.  Will get help right away if you are not doing well or get worse. Document Released: 10/28/2004 Document Revised: 04/12/2011 Document Reviewed: 02/05/2011 [facility] Patient Information 2014 [place], [place].  Take meds as prescribed Follow-up with ortho

## 2013-04-30 NOTE — ED Notes (Signed)
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 Pt complaining of left thumb pain and right arm pain. Pt has previous surgery on right arm.

## 2013-05-03 NOTE — ED Provider Notes (Signed)
Medical screening examination/treatment/procedure(s) were performed by non-physician practitioner and as supervising physician I was immediately available for consultation/collaboration.   EKG Interpretation None        Dagmar HaitWilliam Brinn Westby, MD 05/03/13 [phone]

## 2013-07-15 ENCOUNTER — Encounter (HOSPITAL_COMMUNITY): Payer: Self-pay | Admitting: Emergency Medicine

## 2013-07-15 ENCOUNTER — Emergency Department (HOSPITAL_COMMUNITY): Payer: Self-pay

## 2013-07-15 ENCOUNTER — Emergency Department (HOSPITAL_COMMUNITY)
Admission: EM | Admit: 2013-07-15 | Discharge: 2013-07-15 | Disposition: A | Discharge status: Home / Self Care | Payer: Self-pay | Attending: Emergency Medicine | Admitting: Emergency Medicine

## 2013-07-15 DIAGNOSIS — M25531 Pain in right wrist: Secondary | ICD-10-CM

## 2013-07-15 DIAGNOSIS — Z860100 Personal history of colon polyps, unspecified: Secondary | ICD-10-CM | POA: Insufficient documentation

## 2013-07-15 DIAGNOSIS — W101XXA Fall (on)(from) sidewalk curb, initial encounter: Secondary | ICD-10-CM | POA: Insufficient documentation

## 2013-07-15 DIAGNOSIS — S59919A Unspecified injury of unspecified forearm, initial encounter: Secondary | ICD-10-CM

## 2013-07-15 DIAGNOSIS — M549 Dorsalgia, unspecified: Secondary | ICD-10-CM

## 2013-07-15 DIAGNOSIS — W19XXXA Unspecified fall, initial encounter: Secondary | ICD-10-CM

## 2013-07-15 DIAGNOSIS — Z8719 Personal history of other diseases of the digestive system: Secondary | ICD-10-CM | POA: Insufficient documentation

## 2013-07-15 DIAGNOSIS — S59909A Unspecified injury of unspecified elbow, initial encounter: Secondary | ICD-10-CM | POA: Insufficient documentation

## 2013-07-15 DIAGNOSIS — G8929 Other chronic pain: Secondary | ICD-10-CM | POA: Insufficient documentation

## 2013-07-15 DIAGNOSIS — IMO0002 Reserved for concepts with insufficient information to code with codable children: Secondary | ICD-10-CM | POA: Insufficient documentation

## 2013-07-15 DIAGNOSIS — S298XXA Other specified injuries of thorax, initial encounter: Secondary | ICD-10-CM | POA: Insufficient documentation

## 2013-07-15 DIAGNOSIS — S6990XA Unspecified injury of unspecified wrist, hand and finger(s), initial encounter: Secondary | ICD-10-CM

## 2013-07-15 DIAGNOSIS — Y9389 Activity, other specified: Secondary | ICD-10-CM | POA: Insufficient documentation

## 2013-07-15 DIAGNOSIS — Y929 Unspecified place or not applicable: Secondary | ICD-10-CM | POA: Insufficient documentation

## 2013-07-15 DIAGNOSIS — F17200 Nicotine dependence, unspecified, uncomplicated: Secondary | ICD-10-CM | POA: Insufficient documentation

## 2013-07-15 DIAGNOSIS — Z8739 Personal history of other diseases of the musculoskeletal system and connective tissue: Secondary | ICD-10-CM | POA: Insufficient documentation

## 2013-07-15 MED ORDER — MORPHINE SULFATE 4 MG/ML IJ SOLN
4.0000 mg | Freq: Once | INTRAMUSCULAR | Status: AC
  Administered 2013-07-15: 4 mg via INTRAVENOUS
  Filled 2013-07-15: qty 1

## 2013-07-15 MED ORDER — OXYCODONE-ACETAMINOPHEN 5-325 MG PO TABS
1.0000 | ORAL_TABLET | Freq: Once | ORAL | Status: AC
  Administered 2013-07-15: 1 via ORAL
  Filled 2013-07-15: qty 1

## 2013-07-15 MED ORDER — MORPHINE SULFATE 4 MG/ML IJ SOLN
4.0000 mg | Freq: Once | INTRAMUSCULAR | Status: AC
  Administered 2013-07-15: 4 mg via INTRAVENOUS

## 2013-07-15 MED ORDER — IOHEXOL 300 MG/ML  SOLN
100.0000 mL | Freq: Once | INTRAMUSCULAR | Status: AC | PRN
  Administered 2013-07-15: 100 mL via INTRAVENOUS

## 2013-07-15 MED ORDER — MORPHINE SULFATE 4 MG/ML IJ SOLN
4.0000 mg | Freq: Once | INTRAMUSCULAR | Status: DC
  Filled 2013-07-15: qty 1

## 2013-07-15 MED ORDER — OXYCODONE-ACETAMINOPHEN 5-325 MG PO TABS
1.0000 | ORAL_TABLET | Freq: Four times a day (QID) | ORAL | Status: DC | PRN
Start: 2013-07-15 — End: 2013-09-08

## 2013-07-15 NOTE — ED Notes (Signed)
Pt denies having transportation home, pt to wait 4 hours post last morphine admin or until transportation arrives before being discharged, pt aware

## 2013-07-15 NOTE — ED Notes (Signed)
Pt left before receiving discharge paperwork or discharge vital signs

## 2013-07-15 NOTE — ED Notes (Signed)
Received report, pt noted to not be in room

## 2013-07-15 NOTE — Discharge Instructions (Signed)
Use the resource guide below to find a primary care provider, and follow up with them as needed for your back and wrist pain. Use heat and ice as we discussed for pain. You may use additional tylenol 325mg  every 6 hours if the pain medication I prescribe you is not helping after 2 hours.   Back Pain, Adult Low back pain is very common. About 1 in 5 people have back pain.The cause of low back pain is rarely dangerous. The pain often gets better over time.About half of people with a sudden onset of back pain feel better in just 2 weeks. About 8 in 10 people feel better by 6 weeks.  CAUSES Some common causes of back pain include:  Strain of the muscles or ligaments supporting the spine.  Wear and tear (degeneration) of the spinal discs.  Arthritis.  Direct injury to the back. DIAGNOSIS Most of the time, the direct cause of low back pain is not known.However, back pain can be treated effectively even when the exact cause of the pain is unknown.Answering your caregiver's questions about your overall health and symptoms is one of the most accurate ways to make sure the cause of your pain is not dangerous. If your caregiver needs more information, he or she may order lab work or imaging tests (X-rays or MRIs).However, even if imaging tests show changes in your back, this usually does not require surgery. HOME CARE INSTRUCTIONS For many people, back pain returns.Since low back pain is rarely dangerous, it is often a condition that people can learn to [facility] Hospitalmanageon their own.   Remain active. It is stressful on the back to sit or stand in one place. Do not sit, drive, or stand in one place for more than 30 minutes at a time. Take short walks on level surfaces as soon as pain allows.Try to increase the length of time you walk each day.  Do not stay in bed.Resting more than 1 or 2 days can delay your recovery.  Do not avoid exercise or work.Your body is made to move.It is not dangerous to be  active, even though your back may hurt.Your back will likely heal faster if you return to being active before your pain is gone.  Pay attention to your body when you bend and lift. Many people have less discomfortwhen lifting if they bend their knees, keep the load close to their bodies,and avoid twisting. Often, the most comfortable positions are those that put less stress on your recovering back.  Find a comfortable position to sleep. Use a firm mattress and lie on your side with your knees slightly bent. If you lie on your back, put a pillow under your knees.  Only take over-the-counter or prescription medicines as directed by your caregiver. Over-the-counter medicines to reduce pain and inflammation are often the most helpful.Your caregiver may prescribe muscle relaxant drugs.These medicines help dull your pain so you can more quickly return to your normal activities and healthy exercise.  Put ice on the injured area.  Put ice in a plastic bag.  Place a towel between your skin and the bag.  Leave the ice on for 15-20 minutes, 03-04 times a day for the first 2 to 3 days. After that, ice and heat may be alternated to reduce pain and spasms.  Ask your caregiver about trying back exercises and gentle massage. This may be of some benefit.  Avoid feeling anxious or stressed.Stress increases muscle tension and can worsen back pain.It is important to  recognize when you are anxious or stressed and learn ways to manage it.Exercise is a great option. SEEK MEDICAL CARE IF:  You have pain that is not relieved with rest or medicine.  You have pain that does not improve in 1 week.  You have new symptoms.  You are generally not feeling well. SEEK IMMEDIATE MEDICAL CARE IF:   You have pain that radiates from your back into your legs.  You develop new bowel or bladder control problems.  You have unusual weakness or numbness in your arms or legs.  You develop nausea or vomiting.  You  develop abdominal pain.  You feel faint. Document Released: 01/18/2005 Document Revised: 07/20/2011 Document Reviewed: 06/08/2010 [facility] Patient Information 2014 [employer], [place].     Emergency Department Resource Guide 1) Find a Doctor and Pay Out of Pocket Although you won't have to find out who is covered by your insurance plan, it is a good idea to ask around and get recommendations. You will then need to call the office and see if the doctor you have chosen will accept you as a new patient and what types of options they offer for patients who are self-pay. Some doctors offer discounts or will set up payment plans for their patients who do not have insurance, but you will need to ask so you aren't surprised when you get to your appointment.  2) Contact Your Local Health Department Not all health departments have doctors that can see patients for sick visits, but many do, so it is worth a call to see if yours does. If you don't know where your local health department is, you can check in your phone book. The CDC also has a tool to help you locate your state's health department, and many state websites also have listings of all of their local health departments.  3) Find a Walk-in Clinic If your illness is not likely to be very severe or complicated, you may want to try a walk in clinic. These are popping up all over the country in pharmacies, drugstores, and shopping centers. They're usually staffed by nurse practitioners or physician assistants that have been trained to treat common illnesses and complaints. They're usually fairly quick and inexpensive. However, if you have serious medical issues or chronic medical problems, these are probably not your best option.  No Primary Care Doctor: - Call Health Connect at  [phone] - they can help you locate a primary care doctor that  accepts your insurance, provides certain services, etc. - Physician Referral Service- [phone]  Chronic  Pain Problems: Organization         Address  Phone   Notes  [facility]  [phone] Patients need to be referred by their primary care doctor.   Medication Assistance: Organization         Address  Phone   Notes  [facility] Medication [facility] [address] [place], [place] [phone] --Must be a resident of [facility] [place] -- Must have NO insurance coverage whatsoever (no Medicaid/ Medicare, etc.) -- The pt. MUST have a primary care doctor that directs their care regularly and follows them in the community   MedAssist  [phone]   [employer]  [phone]    Agencies that provide inexpensive medical care: Organization         Address  Phone   Notes  [facility]  [phone]   [facility]    (  [phone]   [facility] [place], [place] [phone]   [place] [address] [place] [phone]   Planned Parenthood    [phone]   [facility]    [phone]   [place] and [place] [address], [place] Phone:  [phone], Fax:  [phone] Hours of Operation:  9 am - 6 pm, M-F.  Also accepts Medicaid/Medicare and self-pay.  [facility] [place] [address], [address], [place] Phone: [phone], Fax: [phone]. Hours of Operation:  8:30 am - 5:30 pm, M-F.  Also accepts Medicaid and self-pay.  [facility] [place] [address] [place] Phone: [phone]   [place], [place], [place] [phone], Ext. [phone] Mondays & Thursdays: 7-9 AM.  First 15 patients are seen on a first come, first serve basis.    [place] Providers:  Organization         Address  Phone   Notes  [facility] [address], [place] [phone] Also  accepts self-pay patients.  [facility] [address], [place]  [phone]   [place], [address], [place] [phone]   [facility] [address] [place] [phone]   Lucianne Lei [address], [place]   [phone] Only accepts [place] Access [place] patients after they have their name applied to their card.   Self-Pay (no insurance) in [facility]:  Organization         Address  Phone   Notes  Sickle Cell Patients, [facility] [place] [phone]   [facility] [place] [phone]   [facility] [place]  [place], [place], [place] [phone]   [facility]/Dr. Osei-Bonsu  [address] [place], [place] or [address], [place] [phone] Phone number for both [place] and [place] locations is the same.  Urgent Medical and [facility] [address] [place] [phone]   [facility] [address] [place] or [address] [phone]   [facility] [address] [place] [phone], phone; [phone], fax Sees patients 1st and 3rd Saturday of every month.  Must not qualify for public or private insurance (i.e. Medicaid, Medicare, [place] Health Choice, Veterans' Benefits)  Household income should be no more than 200% of the poverty level The clinic cannot treat you if you are pregnant or think you are pregnant  Sexually transmitted diseases are not treated at the clinic.    Dental Care: Organization         Address  Phone  Notes  [facility] [phone] Accepts children up to age 57 who are enrolled in [place] or [place]; pregnant  women with a Medicaid card; and children who have applied for Medicaid or [place] Health Choice, but were declined, whose parents can pay a reduced fee at time of service.  [facility] Department of [facility]  [address], [place] [phone] Accepts children up  to age 55 who are enrolled in Medicaid or [place] Health Choice; pregnant women with a Medicaid card; and children who have applied for Medicaid or [place] Health Choice, but were declined, whose parents can pay a reduced fee at time of service.  [place] Adult [facility] Access PROGRAM  [place] [phone] Patients are seen by appointment only. Walk-ins are not accepted. [place] will see patients 3 years of age and older. Monday - Tuesday (8am-5pm) Most Wednesdays (8:30-5pm) $30 per visit, cash only  [facility] Adult Dental Access PROGRAM  [address], [facility] [phone] Patients are seen by appointment only. Walk-ins are not accepted. [facility] will see patients 65 years of age and older. One Wednesday Evening (Monthly: Volunteer Based).  $30 per visit, cash only  [place]  [phone] for adults; Children under age 39, call Graduate Pediatric Dentistry at [phone]. Children aged 40-14, please call [phone] to request a pediatric application.  Dental services are provided in all areas of dental care including fillings, crowns and bridges, complete and partial dentures, implants, gum treatment, root canals, and extractions. Preventive care is also provided. Treatment is provided to both adults and children. Patients are selected via a lottery and there is often a waiting list.   [facility] [address] [place], [place]  [phone] [email or web address]   [facility] [address] [place], [place] [phone], Ext. [phone] Second and Fourth Thursday of each month, opens at 6:30 AM; Clinic ends at 9 AM.  Patients are  seen on a first-come first-served basis, and a limited number are seen during each clinic.   [facility]  [address] Hillard Danker [place], [place] [phone]   Eligibility Requirements You must have lived in [place], [place], or [place] counties for at least the last three months.   You cannot be eligible for state or federal sponsored [employer], including [employer], [place], or [employer].   You generally cannot be eligible for healthcare insurance through your employer.    How to apply: Eligibility screenings are held every Tuesday and Wednesday afternoon from 1:00 pm until 4:00 pm. You do not need an appointment for the interview!  [facility] [address] [place], [place]   [place]  [place] Department  [place]  [phone]    Behavioral Health Resources in the Community: Intensive Outpatient Programs Organization         Address  Phone  Notes  [employer] [place]. [address] [place], [place] [phone]   [facility] [address] [place], [place]   ADS: Alcohol & Drug Svcs [address] [place], [place]   [place] [address],  [place], [place] or [phone]   Substance Abuse Resources Organization         Address  Phone  Notes  Alcohol and Drug Services  [phone]   [place]  [phone]   The [place]   Chinita Pester  [phone]   Residential & Outpatient Substance Abuse Program  [phone]   Psychological Services Organization         Address  Phone  Notes  [place]  [place]  [place]   [place] [phone]  N[address] [place] or [phone]    Mobile Crisis  Teams Organization         Address  Phone  Notes  [facility], Mobile Crisis Care Unit  [phone]   [facility]  [address] [place], [place]   [place] [address] [place] [phone]    Self-Help/Support Groups Organization         Address  Phone             Notes  [place] of [place] - variety of support groups  [place] Call for more information  Narcotics Anonymous (NA), [facility] [address], [employer]   2 meetings at this location   Special educational needs teacher         Address  Phone  Notes  [facility] Residential Treatment [place],    [place]  [phone]   [facility]  [address] [place] [id number], [place], [place]   [place], [place] [phone] Admissions: 8am-3pm M-F  Incentives Substance [place] [address],    [place], [place] [phone]   [facility] [address] [place], [place], [place]   [facility] [address],  [place], [place]   [facility] - Intensive Outpatient [address] Kristeen Mans [address], [place], [place]   [facility] ([place].,  [place], [place] [phone] or [phone]   [facility] Treatment Services (RTS) [address], [place], [place] Accepts Medicaid  [place] [address],  [facility] [place] [phone] Substance Abuse/Addiction Treatment   [facility] Organization         Address  Phone  Notes  [facility]  [phone]   Domenic Schwab, PhD [address] Arlis Porta [place], [place]   [phone] or [phone]   [place], [place] [phone]   [facility] [address] [place] Gerarda Conklin, [place], [place] [phone]  Insurance/Medicaid/sponsorship through [facility] and Families [address], [address] [place]                                    [place], [place] [phone] [place] [address], [place] [phone]    Dr. Adele Schilder  [phone]   [facility] Dept. 1) [address]  2) [place] 3)  [place] [address], [place] [phone]  [phone]   [place] [phone] or [phone] (After Hours)

## 2013-07-15 NOTE — ED Notes (Signed)
Spoke with Wendy RN regarding patient location, she stated that patienToniann [facility]ail[facility], report was given to Clydie BraunKaren RN on pod C prior to that. Upon this RNs arrival to unit patient could not be located x20 min.

## 2013-07-15 NOTE — ED Provider Notes (Signed)
CSN: [id number]     Arrival date & time 07/15/13  1133 History   First MD Initiated Contact with Patient 07/15/13 1506     Chief Complaint  Patient presents with  . Fall     (Consider location/radiation/quality/duration/timing/severity/associated sxs/prior Treatment) HPI Comments: John PettiesDaryl D Devers is a 43y/o male with a PMHx of chronic back, DJD, and falls/fractures in the past, presenting today s/p a fall off a ladder from a height of approx 496ft, which occurred today at 7am. He states he had boards under the ladder which came out from under it, causing him to fall on top of the ladder onto his lower back, and then a 2 x 4 board fell onto his right hand, as well as scraped his scalp. Endorses 9/10 right hand a lower back pain, both constant, achy/throbbing, and non-radiating. Denies LOC, HA, vision changes, paresthesias, cauda equina symptoms, incontinence of urine/stools, dizziness/lightheadedness, or confusion. Tried three 500mg  tylenols PTA which did not help with his pain. States he was given medication in triage which eased the pain slightly but did not provide much relief. Both pains are worse with movement. Denies neck pain/stiffness, CP, SOB, abd pain, N/V, or difficulty moving his bowel or bladder. Denies other injuries during this event. Denies alcohol use, smokes 1 pack every 4 days. No illicit drug use. 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  Patient is a 43 y.o. male presenting with fall. The history is provided by the patient.  Fall This is a new problem. The current episode started today. The problem has been unchanged. Associated symptoms include arthralgias (lower back, right hand) and joint swelling (right hand). Pertinent negatives include no abdominal pain, change in bowel habit, chest pain, chills, coughing, diaphoresis, fever, headaches, nausea, neck pain, numbness, rash, vertigo, visual change, vomiting or weakness. The symptoms are aggravated  by walking. He has tried acetaminophen and oral narcotics for the symptoms. The treatment provided mild (no relief with tylenol, some relief with narcotics) relief.    Past Medical History  Diagnosis Date  . Irregular heartbeat   . Chronic back pain   . Chronic lumbar pain   . GERD (gastroesophageal reflux disease)   . Rectal polyp   . Degenerative disc disease    Past Surgical History  Procedure Laterality Date  . Laceration repair      repair to right arm that severed artery  . Arm surgery    . Groin exploration     Family History  Problem Relation Age of Onset  . Alzheimer's disease Other    History  Substance Use Topics  . Smoking status: Current Every Day Smoker -- 1.00 packs/day    Types: Cigarettes  . Smokeless tobacco: Never Used  . Alcohol Use: No    Review of Systems  Constitutional: Negative for fever, chills and diaphoresis.  HENT: Negative for dental problem, ear discharge, facial swelling, hearing loss and nosebleeds.   Eyes: Negative for pain and visual disturbance.  Respiratory: Negative for cough.   Cardiovascular: Negative for chest pain.  Gastrointestinal: Negative for nausea, vomiting, abdominal pain, diarrhea, constipation, abdominal distention and change in bowel habit.  Genitourinary: Positive for flank pain (overlying lumbar paraspinous regions). Negative for hematuria, decreased urine volume and difficulty urinating.       Denies incontinence of urine or stool  Musculoskeletal: Positive for arthralgias (lower back, right hand), back pain and joint swelling (right hand). Negative for neck pain and neck stiffness.  Skin: Positive for wound (abrasion on scalp). Negative for rash.  Neurological: Negative for dizziness, vertigo, syncope, facial asymmetry, weakness, light-headedness, numbness and headaches.  Psychiatric/Behavioral: Negative for confusion.  All other systems reviewed and are negative.     Allergies  Codeine; Ibuprofen; and Ketorolac  tromethamine  Home Medications   Prior to Admission medications   Medication Sig Start Date End Date Taking? Authorizing Provider  acetaminophen (TYLENOL) 500 MG tablet Take 1,000 mg by mouth every 6 (six) hours as needed for mild pain.   Yes Historical Provider, MD  oxyCODONE-acetaminophen (PERCOCET/ROXICET) 5-325 MG per tablet Take 1 tablet by mouth every 6 (six) hours as needed for severe pain. 07/15/13   Jerry Haugen Strupp Camprubi-Soms, PA-C   BP 112/93  Pulse 71  Temp(Src) 98 F (36.7 C) (Oral)  Resp 18  SpO2 97% Physical Exam  Nursing note and vitals reviewed. Constitutional: He is oriented to person, place, and time. Vital signs are normal. He appears well-developed and well-nourished. No distress.  HENT:  Head: Normocephalic. Head is with abrasion (right sided scalp).  Nose: Nose normal.  Mouth/Throat: Oropharynx is clear and moist and mucous membranes are normal. Normal dentition.  Small 1cm scab/abrasion located on frontal/parietal region of scalp, on right side. No ecchymosis or edema. No loose dentitia  Eyes: Conjunctivae and EOM are normal. Pupils are equal, round, and reactive to light.  Neck: Normal range of motion and full passive range of motion without pain. Neck supple. No spinous process tenderness and no muscular tenderness present.  Cardiovascular: Normal rate, regular rhythm, normal heart sounds and normal pulses.   Pulmonary/Chest: Effort normal and breath sounds normal.  Abdominal: Soft. Normal appearance and bowel sounds are normal. There is no tenderness. There is no rigidity, no rebound, no guarding and no CVA tenderness.  Musculoskeletal: Normal range of motion.       Thoracic back: He exhibits tenderness and bony tenderness.       Lumbar back: He exhibits tenderness and bony tenderness.  ROM of R hand limited by pain, but able to perform. Swelling noted to dorsum of R hand. Strength 5/5, equal b/l. No TTP in right elbow or shoulder.  ROM of lumbar and  thoracic spine limited by pain, but able to perform active ROM. Ecchymosis located over right flank, approx 3cm x 3 cm. Spinous process and paraspinal muscle tenderness in T/L spine, no C-spine tenderness. No crepitus or bony step offs. No edema noted. Strength 5/5 in all extremities, equal b/l.  Neurological: He is alert and oriented to person, place, and time. He has normal strength. No sensory deficit.  Skin: Skin is warm and dry. Abrasion (scalp) and ecchymosis (R flank) noted.  Scalp abrasion described above. Ecchymosis to R flank described above  Psychiatric: He has a normal mood and affect.    ED Course  Procedures (including critical care time) Labs Review Labs Reviewed - No data to display  Imaging Review Dg Lumbar Spine Complete  07/15/2013   CLINICAL DATA:  Fall.  EXAM: LUMBAR SPINE - COMPLETE 4+ VIEW  COMPARISON:  10/07/2012.  FINDINGS: Paraspinal soft tissues are normal. No acute bony abnormality identified. Normal bony alignment present. No evidence of fracture.  IMPRESSION: Negative.   Electronically Signed   By: Maisie Fus  Register   On: 07/15/2013 14:30   Dg Wrist Complete Right  07/15/2013   CLINICAL DATA:  Fall.  EXAM: RIGHT WRIST - COMPLETE 3+ VIEW  COMPARISON:  04/30/2013  FINDINGS: There is no evidence of fracture or  dislocation. There is no evidence of arthropathy or other focal bone abnormality. Soft tissues are unremarkable.  IMPRESSION: Negative.   Electronically Signed   By: Charlett NoseKevin  Dover M.D.   On: 07/15/2013 14:35   Ct Chest W Contrast  07/15/2013   CLINICAL DATA:  6 foot fall from a ladder. Low back pain. History of chronic low back pain.  EXAM: CT CHEST, ABDOMEN, AND PELVIS WITH CONTRAST  TECHNIQUE: Multidetector CT imaging of the chest, abdomen and pelvis was performed following the standard protocol during bolus administration of intravenous contrast.  CONTRAST:  100mL OMNIPAQUE IOHEXOL 300 MG/ML  SOLN  COMPARISON:  Radiographs 10/07/2012.  FINDINGS: CT CHEST FINDINGS   Bones: Thoracic spinal alignment is within normal limits. Old compression fractures of T7 and T8 are present. There is no paravertebral phlegmon or evidence of an acute compression fracture. The sternum is intact. No displaced rib fracture. Sternoclavicular joints are within normal limits.  Lungs:  Clear.  Central airways: Patent.  Vasculature: Normal 3 vessel aortic arch. No acute vascular abnormality.  Effusions: None.  Lymphadenopathy: None.  Esophagus: Normal.  Other: None.  CT ABDOMEN AND PELVIS FINDINGS  Bones: Lumbar spinal alignment and vertebral body height is within normal limits. The pelvic rings are intact. Pubic symphysis and SI joints within normal limits. Both hips are located.  Liver: Normal variant Riedel's lobe of the liver. No mass lesion or biliary ductal dilation.  Spleen:  Normal.  Negative for splenic injury.  Gallbladder:  Normal.  Common bile duct:  Normal.  Pancreas:  Normal.  Adrenal glands:  Normal bilaterally.  Kidneys:  Normal enhancement and delayed excretion of contrast.  Stomach:  Normal.  Small bowel:  Normal.  No mesenteric adenopathy or hemorrhage.  Colon:   Normal appendix.  Colon within normal limits.  Pelvic Genitourinary:  Normal appearance of the urinary bladder.  Vasculature: Normal.  Body Wall: Normal.  IMPRESSION: No acute abnormality. No acute traumatic injury. Old mid to lower thoracic compression fractures, unchanged.   Electronically Signed   By: Andreas NewportGeoffrey  Lamke M.D.   On: 07/15/2013 17:42   Ct Abdomen Pelvis W Contrast  07/15/2013   CLINICAL DATA:  6 foot fall from a ladder. Low back pain. History of chronic low back pain.  EXAM: CT CHEST, ABDOMEN, AND PELVIS WITH CONTRAST  TECHNIQUE: Multidetector CT imaging of the chest, abdomen and pelvis was performed following the standard protocol during bolus administration of intravenous contrast.  CONTRAST:  100mL OMNIPAQUE IOHEXOL 300 MG/ML  SOLN  COMPARISON:  Radiographs 10/07/2012.  FINDINGS: CT CHEST FINDINGS  Bones:  Thoracic spinal alignment is within normal limits. Old compression fractures of T7 and T8 are present. There is no paravertebral phlegmon or evidence of an acute compression fracture. The sternum is intact. No displaced rib fracture. Sternoclavicular joints are within normal limits.  Lungs:  Clear.  Central airways: Patent.  Vasculature: Normal 3 vessel aortic arch. No acute vascular abnormality.  Effusions: None.  Lymphadenopathy: None.  Esophagus: Normal.  Other: None.  CT ABDOMEN AND PELVIS FINDINGS  Bones: Lumbar spinal alignment and vertebral body height is within normal limits. The pelvic rings are intact. Pubic symphysis and SI joints within normal limits. Both hips are located.  Liver: Normal variant Riedel's lobe of the liver. No mass lesion or biliary ductal dilation.  Spleen:  Normal.  Negative for splenic injury.  Gallbladder:  Normal.  Common bile duct:  Normal.  Pancreas:  Normal.  Adrenal glands:  Normal bilaterally.  Kidneys:  Normal  enhancement and delayed excretion of contrast.  Stomach:  Normal.  Small bowel:  Normal.  No mesenteric adenopathy or hemorrhage.  Colon:   Normal appendix.  Colon within normal limits.  Pelvic Genitourinary:  Normal appearance of the urinary bladder.  Vasculature: Normal.  Body Wall: Normal.  IMPRESSION: No acute abnormality. No acute traumatic injury. Old mid to lower thoracic compression fractures, unchanged.   Electronically Signed   By: Andreas Newport M.D.   On: 07/15/2013 17:42   Dg Hand Complete Right  07/15/2013   CLINICAL DATA:  Fall from ladder.  Right hand and wrist pain.  EXAM: RIGHT HAND - COMPLETE 3+ VIEW  COMPARISON:  None.  FINDINGS: Soft tissue swelling is present over the dorsum of the hand. No acute osseous abnormality is present. There is no radiopaque foreign body.  IMPRESSION: 1. Soft swelling over the dorsum of the hand without an acute fracture. 2. No acute osseous abnormality.   Electronically Signed   By: Gennette Pac M.D.   On: 07/15/2013  14:30     EKG Interpretation None      MDM   Final diagnoses:  Back pain  Wrist pain, right  Fall    Daryl D Kunert is a 43y/o male with a PMHx of chronic back pain, DJD, and multiple visits for falls/fractures, presenting today s/p fall at 7am, fell onto his lower back from height of 66ft, also injured right hand. Xrays of Lumbar spine and R hand/wrist all negative for fx. Given his R flank ecchymosis, will obtain CT to r/o retroperitoneal bleed or kidney injury. Low clinical suspicion for head injury given unremarkable neuro exam as well as asymptomatic and unimpressive scalp abrasion, therefore do not believe we need head CT at this point. Low clinical suspicion for cauda equina syndrome. Will give Morphine and reassess pain after CT.  5:58 PM Pt back from CT, states the morphine initially helped his pain but then in CT his pain increased due to the movement. CT unremarkable. He would like more morphine prior to d/c, discussed with pt that he will need a ride home. Will d/c home with pain meds and advised to f/up with a doctor from the resource guide. Ice/heat for pain. Discussed with patient return precautions, pt understands and agrees.  Pt stable for D/C.  BP 112/93  Pulse 71  Temp(Src) 98 F (36.7 C) (Oral)  Resp 18  SpO2 97%   [employer], PA-C 07/15/13 1829

## 2013-07-15 NOTE — ED Notes (Signed)
Pt reports having transportation home

## 2013-07-15 NOTE — ED Notes (Addendum)
Was working on a approx 6 foot ladder this am, ladder slipped out from under pt, landed on back on top of ladder. C/o severe low back pain and right hand pain. Pt has bruise on right flank area-- also states 2x4 hit him on head-- no LOC

## 2013-07-17 NOTE — ED Provider Notes (Signed)
Medical screening examination/treatment/procedure(s) were performed by non-physician practitioner and as supervising physician I was immediately available for consultation/collaboration.   EKG Interpretation None        Shanna CiscoMegan E Unique Searfoss, MD 07/17/13 1857

## 2013-09-08 ENCOUNTER — Encounter (HOSPITAL_COMMUNITY): Payer: Self-pay | Admitting: Emergency Medicine

## 2013-09-08 ENCOUNTER — Emergency Department (HOSPITAL_COMMUNITY)
Admission: EM | Admit: 2013-09-08 | Discharge: 2013-09-08 | Disposition: A | Discharge status: Home / Self Care | Payer: Self-pay | Attending: Emergency Medicine | Admitting: Emergency Medicine

## 2013-09-08 ENCOUNTER — Emergency Department (HOSPITAL_COMMUNITY): Payer: Self-pay

## 2013-09-08 DIAGNOSIS — K921 Melena: Secondary | ICD-10-CM

## 2013-09-08 DIAGNOSIS — Z8679 Personal history of other diseases of the circulatory system: Secondary | ICD-10-CM | POA: Insufficient documentation

## 2013-09-08 DIAGNOSIS — R42 Dizziness and giddiness: Secondary | ICD-10-CM | POA: Insufficient documentation

## 2013-09-08 DIAGNOSIS — R51 Headache: Secondary | ICD-10-CM | POA: Insufficient documentation

## 2013-09-08 DIAGNOSIS — K922 Gastrointestinal hemorrhage, unspecified: Secondary | ICD-10-CM | POA: Insufficient documentation

## 2013-09-08 DIAGNOSIS — Z8739 Personal history of other diseases of the musculoskeletal system and connective tissue: Secondary | ICD-10-CM | POA: Insufficient documentation

## 2013-09-08 DIAGNOSIS — F17200 Nicotine dependence, unspecified, uncomplicated: Secondary | ICD-10-CM | POA: Insufficient documentation

## 2013-09-08 DIAGNOSIS — R109 Unspecified abdominal pain: Secondary | ICD-10-CM

## 2013-09-08 DIAGNOSIS — R1011 Right upper quadrant pain: Secondary | ICD-10-CM | POA: Insufficient documentation

## 2013-09-08 DIAGNOSIS — G8929 Other chronic pain: Secondary | ICD-10-CM | POA: Insufficient documentation

## 2013-09-08 LAB — COMPREHENSIVE METABOLIC PANEL
ALBUMIN: 3.7 g/dL (ref 3.5–5.2)
ALT: 17 U/L (ref 0–53)
AST: 19 U/L (ref 0–37)
Alkaline Phosphatase: 75 U/L (ref 39–117)
Anion gap: 13 (ref 5–15)
BUN: 12 mg/dL (ref 6–23)
CALCIUM: 9.2 mg/dL (ref 8.4–10.5)
CO2: 26 mEq/L (ref 19–32)
CREATININE: 0.88 mg/dL (ref 0.50–1.35)
Chloride: 102 mEq/L (ref 96–112)
GFR calc Af Amer: >90 mL/min (ref >90)
GFR calc non Af Amer: >90 mL/min (ref >90)
Glucose, Bld: 107 mg/dL — ABNORMAL HIGH (ref 70–99)
Potassium: 3.4 mEq/L — ABNORMAL LOW (ref 3.7–5.3)
Sodium: 141 mEq/L (ref 137–147)
TOTAL PROTEIN: 6.5 g/dL (ref 6.0–8.3)
Total Bilirubin: 0.3 mg/dL (ref 0.3–1.2)

## 2013-09-08 LAB — CBC WITH DIFFERENTIAL/PLATELET
BASOS PCT: 0 % (ref 0–1)
Basophils Absolute: 0 10*3/uL (ref 0.0–0.1)
Eosinophils Absolute: 0.2 10*3/uL (ref 0.0–0.7)
Eosinophils Relative: 2 % (ref 0–5)
HCT: 41.6 % (ref 39.0–52.0)
HEMOGLOBIN: 14.5 g/dL (ref 13.0–17.0)
Lymphocytes Relative: 21 % (ref 12–46)
Lymphs Abs: 2.3 10*3/uL (ref 0.7–4.0)
MCH: 32 pg (ref 26.0–34.0)
MCHC: 34.9 g/dL (ref 30.0–36.0)
MCV: 91.8 fL (ref 78.0–100.0)
MONO ABS: 0.6 10*3/uL (ref 0.1–1.0)
MONOS PCT: 5 % (ref 3–12)
Neutro Abs: 7.8 10*3/uL — ABNORMAL HIGH (ref 1.7–7.7)
Neutrophils Relative %: 72 % (ref 43–77)
Platelets: 190 10*3/uL (ref 150–400)
RBC: 4.53 MIL/uL (ref 4.22–5.81)
RDW: 13 % (ref 11.5–15.5)
WBC: 10.8 10*3/uL — ABNORMAL HIGH (ref 4.0–10.5)

## 2013-09-08 LAB — LIPASE, BLOOD: LIPASE: 17 U/L (ref 11–59)

## 2013-09-08 MED ORDER — ONDANSETRON HCL 4 MG/2ML IJ SOLN
4.0000 mg | Freq: Once | INTRAMUSCULAR | Status: AC
  Administered 2013-09-08: 4 mg via INTRAVENOUS
  Filled 2013-09-08: qty 2

## 2013-09-08 MED ORDER — SODIUM CHLORIDE 0.9 % IV SOLN
INTRAVENOUS | Status: DC
  Administered 2013-09-08: 22:00:00 via INTRAVENOUS

## 2013-09-08 MED ORDER — HYDROMORPHONE HCL PF 1 MG/ML IJ SOLN
1.0000 mg | Freq: Once | INTRAMUSCULAR | Status: AC
Start: 2013-09-08 — End: 2013-09-08
  Administered 2013-09-08: 1 mg via INTRAVENOUS
  Filled 2013-09-08: qty 1

## 2013-09-08 MED ORDER — SODIUM CHLORIDE 0.9 % IV BOLUS (SEPSIS)
500.0000 mL | Freq: Once | INTRAVENOUS | Status: AC
  Administered 2013-09-08: 500 mL via INTRAVENOUS

## 2013-09-08 MED ORDER — IOHEXOL 300 MG/ML  SOLN
100.0000 mL | Freq: Once | INTRAMUSCULAR | Status: AC | PRN
  Administered 2013-09-08: 100 mL via INTRAVENOUS

## 2013-09-08 MED ORDER — HYDROCODONE-ACETAMINOPHEN 5-325 MG PO TABS: 1.0000 | ORAL_TABLET | Freq: Four times a day (QID) | ORAL | Status: DC | PRN

## 2013-09-08 MED ORDER — PROMETHAZINE HCL 25 MG PO TABS: 25.0000 mg | ORAL_TABLET | Freq: Four times a day (QID) | ORAL | Status: DC | PRN

## 2013-09-08 MED ORDER — IOHEXOL 300 MG/ML  SOLN
50.0000 mL | Freq: Once | INTRAMUSCULAR | Status: AC | PRN
  Administered 2013-09-08: 50 mL via ORAL

## 2013-09-08 MED ORDER — HYDROMORPHONE HCL PF 1 MG/ML IJ SOLN
1.0000 mg | Freq: Once | INTRAMUSCULAR | Status: AC
  Administered 2013-09-08: 1 mg via INTRAVENOUS
  Filled 2013-09-08: qty 1

## 2013-09-08 NOTE — Discharge Instructions (Signed)
Abdominal Pain Many things can cause belly (abdominal) pain. Most times, the belly pain is not dangerous. Many cases of belly pain can be watched and treated at home. HOME CARE   Do not take medicines that help you go poop (laxatives) unless told to by your doctor.  Only take medicine as told by your doctor.  Eat or drink as told by your doctor. Your doctor will tell you if you should be on a special diet. GET HELP IF:  You do not know what is causing your belly pain.  You have belly pain while you are sick to your stomach (nauseous) or have runny poop (diarrhea).  You have pain while you pee or poop.  Your belly pain wakes you up at night.  You have belly pain that gets worse or better when you eat.  You have belly pain that gets worse when you eat fatty foods.  You have a fever. GET HELP RIGHT AWAY IF:   The pain does not go away within 2 hours.  You keep throwing up (vomiting).  The pain changes and is only in the right or left part of the belly.  You have bloody or tarry looking poop. MAKE SURE YOU:   Understand these instructions.  Will watch your condition.  Will get help right away if you are not doing well or get worse. Document Released: 07/07/2007 Document Revised: 01/23/2013 Document Reviewed: 09/27/2012 [facility] Patient Information 2015 [employer] [place]. This information is not intended to replace advice given to you by your health care provider. Make sure you discuss any questions you have with your health care provider. Workup here in the emergency part without significant findings. No evidence of significant blood loss no complicating factors from the surgery. By history patient states that there's been some black stool. Followup for colonoscopy with GI would be important referral information provided. Take pain medicine as directed take Phenergan as needed for nausea. Return for any newer worse symptoms. Return for bowel movements of pools of red blood.

## 2013-09-08 NOTE — ED Provider Notes (Signed)
CSN: [id number]     Arrival date & time 09/08/13  1831 History   First MD Initiated Contact with Patient 09/08/13 1853   This chart was scribed for Vanetta MuldersScott Marykathryn Carboni, MD by Gwenevere AbbotAlexis Brown, ED scribe. This patient was seen in room APA09/APA09 and the patient's care was started at 7:15 PM.    Chief Complaint  Patient presents with  . Abdominal Pain   Patient is a 43 y.o. male presenting with abdominal pain. The history is provided by the patient. No language interpreter was used.  Abdominal Pain Pain location:  RUQ, LLQ and RLQ Pain quality: sharp and stabbing   Pain radiates to:  Does not radiate Pain severity:  Severe Onset quality:  Gradual Duration:  7 days Timing:  Constant Progression:  Worsening Associated symptoms: cough, nausea and vomiting   Associated symptoms: no chest pain, no chills, no diarrhea, no dysuria, no fever, no shortness of breath and no sore throat    HPI Comments:  John Clark is a 43 y.o. male who presents to the Emergency Department complaining of severe, stabbing 9/ 10 abdominal pain primarily in the RUQ and both LQ, onset 7 days ago.  Pt states that he had [place] in [place] two weeks ago, which was performed laparoscopically. Pt states that there has more recently been blood in the stool. Pt reports that the amount of blood varies, along with nausea and vomiting.   Past Medical History  Diagnosis Date  . Irregular heartbeat   . Chronic back pain   . Chronic lumbar pain   . GERD (gastroesophageal reflux disease)   . Rectal polyp   . Degenerative disc disease    Past Surgical History  Procedure Laterality Date  . Laceration repair      repair to right arm that severed artery  . Arm surgery    . Groin exploration    . Cholecystectomy     Family History  Problem Relation Age of Onset  . Alzheimer's disease Other    History  Substance Use Topics  . Smoking status: Current Every Day Smoker -- 1.00 packs/day    Types:  Cigarettes  . Smokeless tobacco: Never Used  . Alcohol Use: No    Review of Systems  Constitutional: Negative for fever and chills.  HENT: Negative for rhinorrhea and sore throat.   Eyes: Negative for visual disturbance.  Respiratory: Positive for cough. Negative for shortness of breath.   Cardiovascular: Negative for chest pain and leg swelling.  Gastrointestinal: Positive for nausea, vomiting, abdominal pain and blood in stool. Negative for diarrhea.  Genitourinary: Negative for dysuria.  Musculoskeletal: Negative for back pain and neck pain.  Skin: Negative for rash.  Neurological: Positive for light-headedness and headaches. Negative for dizziness.  Hematological: Does not bruise/bleed easily.  Psychiatric/Behavioral: Negative for confusion.      Allergies  Codeine; Ibuprofen; Ketorolac tromethamine; and Nsaids  Home Medications   Prior to Admission medications   Medication Sig Start Date End Date Taking? Authorizing Provider  HYDROcodone-acetaminophen (NORCO/VICODIN) 5-325 MG per tablet Take 1-2 tablets by mouth every 6 (six) hours as needed. 09/08/13   Vanetta MuldersScott Fatema Rabe, MD  promethazine (PHENERGAN) 25 MG tablet Take 1 tablet (25 mg total) by mouth every 6 (six) hours as needed. 09/08/13   Vanetta MuldersScott Landen Knoedler, MD   BP 133/92  Pulse 81  Temp(Src) 98.1 F (36.7 C) (Oral)  Resp 16  Ht 5\' 9"  (1.753 m)  Wt 230 lb (104.327 kg)  BMI 33.95  kg/m2  SpO2 95% Physical Exam  Nursing note and vitals reviewed. Constitutional: He is oriented to person, place, and time. He appears well-developed and well-nourished.  HENT:  Head: Normocephalic and atraumatic.  Eyes: EOM are normal.  Neck: Normal range of motion. Neck supple.  Cardiovascular: Normal rate and regular rhythm.   No murmur heard. Pulmonary/Chest: Effort normal and breath sounds normal.  Abdominal: He exhibits distension. There is no guarding.  Decreased bowel sounds  Genitourinary: Guaiac positive stool.  Dark in color.   Musculoskeletal: Normal range of motion.  Neurological: He is alert and oriented to person, place, and time. No cranial nerve deficit. He exhibits normal muscle tone. Coordination normal.  Skin: Skin is warm and dry.  Incisions well healed from procedure   Psychiatric: He has a normal mood and affect. His behavior is normal.    ED Course  Procedures  DIAGNOSTIC STUDIES: Oxygen Saturation is 95% on RA, adequate by my interpretation.  COORDINATION OF CARE: 7:22 PM-Discussed treatment plan with pt at bedside and pt agreed to plan. Results for orders placed during the hospital encounter of 09/08/13  CBC WITH DIFFERENTIAL      Result Value Ref Range   WBC 10.8 (*) 4.0 - 10.5 K/uL   RBC 4.53  4.22 - 5.81 MIL/uL   Hemoglobin 14.5  13.0 - 17.0 g/dL   HCT 16.1  09.6 - 04.5 %   MCV 91.8  78.0 - 100.0 fL   MCH 32.0  26.0 - 34.0 pg   MCHC 34.9  30.0 - 36.0 g/dL   RDW 40.9  81.1 - 91.4 %   Platelets 190  150 - 400 K/uL   Neutrophils Relative % 72  43 - 77 %   Neutro Abs 7.8 (*) 1.7 - 7.7 K/uL   Lymphocytes Relative 21  12 - 46 %   Lymphs Abs 2.3  0.7 - 4.0 K/uL   Monocytes Relative 5  3 - 12 %   Monocytes Absolute 0.6  0.1 - 1.0 K/uL   Eosinophils Relative 2  0 - 5 %   Eosinophils Absolute 0.2  0.0 - 0.7 K/uL   Basophils Relative 0  0 - 1 %   Basophils Absolute 0.0  0.0 - 0.1 K/uL  COMPREHENSIVE METABOLIC PANEL      Result Value Ref Range   Sodium 141  137 - 147 mEq/L   Potassium 3.4 (*) 3.7 - 5.3 mEq/L   Chloride 102  96 - 112 mEq/L   CO2 26  19 - 32 mEq/L   Glucose, Bld 107 (*) 70 - 99 mg/dL   BUN 12  6 - 23 mg/dL   Creatinine, Ser 7.82  0.50 - 1.35 mg/dL   Calcium 9.2  8.4 - 95.6 mg/dL   Total Protein 6.5  6.0 - 8.3 g/dL   Albumin 3.7  3.5 - 5.2 g/dL   AST 19  0 - 37 U/L   ALT 17  0 - 53 U/L   Alkaline Phosphatase 75  39 - 117 U/L   Total Bilirubin 0.3  0.3 - 1.2 mg/dL   GFR calc non Af Amer >90  >90 mL/min   GFR calc Af Amer >90  >90 mL/min   Anion gap 13  5 - 15   LIPASE, BLOOD      Result Value Ref Range   Lipase 17  11 - 59 U/L   Dg Chest 2 View  09/08/2013   CLINICAL DATA:  ABDOMINAL PAIN  EXAM: CHEST  2 VIEW  COMPARISON:  Prior radiograph 04/17/2012  FINDINGS: The cardiac and mediastinal silhouettes are stable in size and contour, and remain within normal limits.  The lungs are normally inflated. No airspace consolidation, pleural effusion, or pulmonary edema is identified. There is no pneumothorax.  No acute osseous abnormality identified.  IMPRESSION: No active cardiopulmonary disease.   Electronically Signed   By: Rise Mu M.D.   On: 09/08/2013 21:52   Ct Abdomen Pelvis W Contrast  09/08/2013   CLINICAL DATA:  Abdominal pain  EXAM: CT ABDOMEN AND PELVIS WITH CONTRAST  TECHNIQUE: Multidetector CT imaging of the abdomen and pelvis was performed using the standard protocol following bolus administration of intravenous contrast.  CONTRAST:  OMNIPAQUE IOHEXOL 300 MG/ML  SOLN  COMPARISON:  07/15/2013  FINDINGS: Lung bases are clear.  Liver, spleen, pancreas, and adrenal glands within normal limits.  Status post cholecystectomy. No intrahepatic or extrahepatic ductal dilatation.  Kidneys are within normal limits.  No evidence of bowel obstruction.  Normal appendix.  No evidence of abdominal aortic aneurysm.  No abdominopelvic ascites.  No suspicious abdominopelvic lymphadenopathy.  Prostate is unremarkable.  Bladder is mildly thick-walled although underdistended.  Mild degenerative changes of the visualized thoracolumbar spine.  IMPRESSION: No evidence of bowel obstruction.  Normal appendix.  No CT findings to account for the patient's abdominal pain.   Electronically Signed   By: Charline Bills M.D.   On: 09/08/2013 21:43       EKG Interpretation None      MDM   Final diagnoses:  Abdominal pain, unspecified abdominal location  Gastrointestinal hemorrhage with melena    Patient status post removal of gallbladder no complicating  factors by CT or labs. Patient with history of melena. Dark stools here quite positive. Hemoglobin and hematocrit is stable. Workup for the blood in the bowel movements can be done as an outpatient colonoscopy most appropriate. Referral to GI provided. Donald pain will be treated with pain medication.    I personally performed the services described in this documentation, which was scribed in my presence. The recorded information has been reviewed and is accurate.       Vanetta Mulders, MD 09/08/13 [phone]

## 2013-09-08 NOTE — ED Notes (Signed)
Pt states that he had gallbladder surgery in [place] two weeks ago, started having mid center abd pain, decreased appetite, blood in his stool a week ago,

## 2013-09-14 ENCOUNTER — Emergency Department (HOSPITAL_COMMUNITY)
Admission: EM | Admit: 2013-09-14 | Discharge: 2013-09-14 | Disposition: A | Discharge status: Home / Self Care | Payer: Self-pay | Attending: Emergency Medicine | Admitting: Emergency Medicine

## 2013-09-14 ENCOUNTER — Encounter (HOSPITAL_COMMUNITY): Payer: Self-pay | Admitting: Emergency Medicine

## 2013-09-14 DIAGNOSIS — K089 Disorder of teeth and supporting structures, unspecified: Secondary | ICD-10-CM | POA: Insufficient documentation

## 2013-09-14 DIAGNOSIS — G8929 Other chronic pain: Secondary | ICD-10-CM | POA: Insufficient documentation

## 2013-09-14 DIAGNOSIS — F17200 Nicotine dependence, unspecified, uncomplicated: Secondary | ICD-10-CM | POA: Insufficient documentation

## 2013-09-14 DIAGNOSIS — Z8679 Personal history of other diseases of the circulatory system: Secondary | ICD-10-CM | POA: Insufficient documentation

## 2013-09-14 DIAGNOSIS — K0889 Other specified disorders of teeth and supporting structures: Secondary | ICD-10-CM

## 2013-09-14 DIAGNOSIS — Z8739 Personal history of other diseases of the musculoskeletal system and connective tissue: Secondary | ICD-10-CM | POA: Insufficient documentation

## 2013-09-14 DIAGNOSIS — K029 Dental caries, unspecified: Secondary | ICD-10-CM | POA: Insufficient documentation

## 2013-09-14 MED ORDER — PENICILLIN V POTASSIUM 500 MG PO TABS: 500.0000 mg | ORAL_TABLET | Freq: Four times a day (QID) | ORAL | Status: AC

## 2013-09-14 MED ORDER — TRAMADOL HCL 50 MG PO TABS: 50.0000 mg | ORAL_TABLET | Freq: Four times a day (QID) | ORAL | Status: DC | PRN

## 2013-09-14 NOTE — Discharge Instructions (Signed)

## 2013-09-14 NOTE — ED Notes (Signed)
Patient given discharge instruction, verbalized understand. Patient ambulatory out of the department.  

## 2013-09-14 NOTE — ED Provider Notes (Signed)
CSN: [id number]     Arrival date & time 09/14/13  1800 History   First MD Initiated Contact with Patient 09/14/13 1832     Chief Complaint  Patient presents with  . Dental Pain     (Consider location/radiation/quality/duration/timing/severity/associated sxs/prior Treatment) HPI Comments: Presents to the ER for evaluation of dental pain. Patient reports that his left lower molar broke today. He tried to put Orajel on it but it did not help. Patient complains of constant severe pain on the left side of his mouth and face secondary to the dental problem. He tells me he has a dentist appointment on Monday.  Patient is a 43 y.o. male presenting with tooth pain.  Dental Pain   Past Medical History  Diagnosis Date  . Irregular heartbeat   . Chronic back pain   . Chronic lumbar pain   . GERD (gastroesophageal reflux disease)   . Rectal polyp   . Degenerative disc disease    Past Surgical History  Procedure Laterality Date  . Laceration repair      repair to right arm that severed artery  . Arm surgery    . Groin exploration    . Cholecystectomy     Family History  Problem Relation Age of Onset  . Alzheimer's disease Other    History  Substance Use Topics  . Smoking status: Current Every Day Smoker -- 1.00 packs/day    Types: Cigarettes  . Smokeless tobacco: Never Used  . Alcohol Use: No    Review of Systems  HENT: Positive for dental problem.   All other systems reviewed and are negative.     Allergies  Codeine; Ibuprofen; Ketorolac tromethamine; and Nsaids  Home Medications   Prior to Admission medications   Medication Sig Start Date End Date Taking? Authorizing Provider  HYDROcodone-acetaminophen (NORCO/VICODIN) 5-325 MG per tablet Take 1-2 tablets by mouth every 6 (six) hours as needed. 09/08/13   Vanetta Mulders, MD  promethazine (PHENERGAN) 25 MG tablet Take 1 tablet (25 mg total) by mouth every 6 (six) hours as needed. 09/08/13   Vanetta Mulders, MD   BP 122/70   Pulse 83  Temp(Src) 98.5 F (36.9 C) (Oral)  Resp 22  SpO2 100% Physical Exam  Constitutional: He is oriented to person, place, and time. He appears well-developed and well-nourished. No distress.  HENT:  Head: Normocephalic and atraumatic.  Right Ear: Hearing normal.  Left Ear: Hearing normal.  Nose: Nose normal.  Mouth/Throat: Oropharynx is clear and moist and mucous membranes are normal.    Eyes: Conjunctivae and EOM are normal. Pupils are equal, round, and reactive to light.  Neck: Normal range of motion. Neck supple.  Cardiovascular: Regular rhythm, S1 normal and S2 normal.  Exam reveals no gallop and no friction rub.   No murmur heard. Pulmonary/Chest: Effort normal and breath sounds normal. No respiratory distress. He exhibits no tenderness.  Abdominal: Soft. Normal appearance and bowel sounds are normal. There is no hepatosplenomegaly. There is no tenderness. There is no rebound, no guarding, no tenderness at McBurney's point and negative Murphy's sign. No hernia.  Musculoskeletal: Normal range of motion.  Neurological: He is alert and oriented to person, place, and time. He has normal strength. No cranial nerve deficit or sensory deficit. Coordination normal. GCS eye subscore is 4. GCS verbal subscore is 5. GCS motor subscore is 6.  Skin: Skin is warm, dry and intact. No rash noted. No cyanosis.  Psychiatric: He has a normal mood and affect. His speech  is normal and behavior is normal. Thought content normal.    ED Course  Procedures (including critical care time) Labs Review Labs Reviewed - No data to display  Imaging Review No results found.   EKG Interpretation None      MDM   Final diagnoses:  None  Dentalgia  Patient reports dental fracture secondary to difficulty K. earlier today. Complaining of pain. We'll prescribe Pen-Vee K, Ultram. He reports dental followup already scheduled.    Gilda Creasehristopher J. Pollina, MD 09/14/13 [phone]

## 2013-09-14 NOTE — ED Notes (Signed)
MD in room to see pt before RN assessed

## 2013-09-14 NOTE — ED Notes (Signed)
PT stated he broke a tooth on the bottom left side this morning and started having pain 1 hour ago.

## 2013-10-07 ENCOUNTER — Emergency Department (HOSPITAL_COMMUNITY)
Admission: EM | Admit: 2013-10-07 | Discharge: 2013-10-07 | Disposition: A | Discharge status: Home / Self Care | Payer: Self-pay | Attending: Emergency Medicine | Admitting: Emergency Medicine

## 2013-10-07 ENCOUNTER — Encounter (HOSPITAL_COMMUNITY): Payer: Self-pay | Admitting: Emergency Medicine

## 2013-10-07 DIAGNOSIS — Z8739 Personal history of other diseases of the musculoskeletal system and connective tissue: Secondary | ICD-10-CM | POA: Insufficient documentation

## 2013-10-07 DIAGNOSIS — F17200 Nicotine dependence, unspecified, uncomplicated: Secondary | ICD-10-CM | POA: Insufficient documentation

## 2013-10-07 DIAGNOSIS — K089 Disorder of teeth and supporting structures, unspecified: Secondary | ICD-10-CM | POA: Insufficient documentation

## 2013-10-07 DIAGNOSIS — Z8679 Personal history of other diseases of the circulatory system: Secondary | ICD-10-CM | POA: Insufficient documentation

## 2013-10-07 DIAGNOSIS — G8929 Other chronic pain: Secondary | ICD-10-CM | POA: Insufficient documentation

## 2013-10-07 DIAGNOSIS — K032 Erosion of teeth: Secondary | ICD-10-CM | POA: Insufficient documentation

## 2013-10-07 MED ORDER — HYDROCODONE-ACETAMINOPHEN 5-325 MG PO TABS: 1.0000 | ORAL_TABLET | Freq: Four times a day (QID) | ORAL | Status: DC | PRN

## 2013-10-07 NOTE — ED Notes (Signed)
Pt reports that yesterday he had a tooth break in the left, posterior, mandibular arch. Pt reports that he has taken tramadol and tylenol without relief. Pt has multiple silver restorations noted to the area. Pt is A/O x4 and vitals are WDL.

## 2013-10-07 NOTE — ED Provider Notes (Signed)
CSN: [id number]     Arrival date & time 10/07/13  1411 History  This chart was scribed for Charlestine Night, PA-C, working with Purvis Sheffield, MD by Chestine Spore, ED Scribe. The patient was seen in room WTR5/WTR5 at 3:19 PM.     Chief Complaint  Patient presents with  . Dental Pain      The history is provided by the patient. No language interpreter was used.   HPI Comments: John Clark is a 43 y.o. male who presents to the Emergency Department complaining of left bottom dental pain onset yesterday. He states that he was eating yesterday and his left back tooth broke. He states that he has not been to sleep all night because of the pain. He states that he has not had any similar symptoms happen to this tooth before. He states that he has tried Tramadol and Tylenol with no relief for his symptoms. He denies any other associated symptoms.  Past Medical History  Diagnosis Date  . Irregular heartbeat   . Chronic back pain   . Chronic lumbar pain   . GERD (gastroesophageal reflux disease)   . Rectal polyp   . Degenerative disc disease    Past Surgical History  Procedure Laterality Date  . Laceration repair      repair to right arm that severed artery  . Arm surgery    . Groin exploration    . Cholecystectomy     Family History  Problem Relation Age of Onset  . Alzheimer's disease Other    History  Substance Use Topics  . Smoking status: Current Every Day Smoker -- 1.00 packs/day    Types: Cigarettes  . Smokeless tobacco: Never Used  . Alcohol Use: No    Review of Systems  HENT: Positive for dental problem.   All other systems reviewed and are negative.    Allergies  Codeine; Ibuprofen; Ketorolac tromethamine; and Nsaids  Home Medications   Prior to Admission medications   Medication Sig Start Date End Date Taking? Authorizing Provider  acetaminophen (TYLENOL) 500 MG tablet Take 1,000 mg by mouth every 6 (six) hours as needed for mild pain or moderate pain.     Yes Historical Provider, MD  benzocaine (ORAJEL) 10 % mucosal gel Use as directed 1 application in the mouth or throat as needed for mouth pain.   Yes Historical Provider, MD  traMADol (ULTRAM) 50 MG tablet Take 1 tablet (50 mg total) by mouth every 6 (six) hours as needed. 09/14/13  Yes Gilda Crease, MD   BP 150/91  Pulse 95  Temp(Src) 98.1 F (36.7 C) (Oral)  Resp 22  SpO2 100%  Physical Exam  Nursing note and vitals reviewed. Constitutional: He is oriented to person, place, and time. He appears well-developed and well-nourished. No distress.  HENT:  Head: Normocephalic and atraumatic.  Broken medial aspect of first molar on the left lower was broken. There was a filling in place on the tooth as well.   Eyes: Pupils are equal, round, and reactive to light.  Neck: Normal range of motion. Neck supple.  Pulmonary/Chest: Effort normal. No respiratory distress.  Musculoskeletal: Normal range of motion.  Neurological: He is alert and oriented to person, place, and time.  Skin: Skin is warm and dry.  Psychiatric: He has a normal mood and affect. His behavior is normal.    ED Course  Procedures (including critical care time) DIAGNOSTIC STUDIES: Oxygen Saturation is 100% on room air, normal by my interpretation.  COORDINATION OF CARE: 3:21 PM-Discussed treatment plan which includes referral to Dentist and hydrocodone with pt at bedside and pt agreed to plan.    I personally performed the services described in this documentation, which was scribed in my presence. The recorded information has been reviewed and is accurate.      Carlyle Dolly, PA-C 10/07/13 [phone]

## 2013-10-07 NOTE — Discharge Instructions (Signed)
Return here as needed. Follow up with the dentist provided. °

## 2013-10-08 NOTE — ED Provider Notes (Signed)
Medical screening examination/treatment/procedure(s) were performed by non-physician practitioner and as supervising physician I was immediately available for consultation/collaboration.   EKG Interpretation None        Purvis Sheffield, MD 10/08/13 1008

## 2013-11-03 ENCOUNTER — Encounter (HOSPITAL_COMMUNITY): Payer: Self-pay | Admitting: Emergency Medicine

## 2013-11-03 ENCOUNTER — Emergency Department (HOSPITAL_COMMUNITY)
Admission: EM | Admit: 2013-11-03 | Discharge: 2013-11-03 | Disposition: A | Discharge status: Home / Self Care | Payer: Self-pay | Attending: Emergency Medicine | Admitting: Emergency Medicine

## 2013-11-03 ENCOUNTER — Emergency Department (HOSPITAL_COMMUNITY): Payer: Self-pay

## 2013-11-03 DIAGNOSIS — Z8679 Personal history of other diseases of the circulatory system: Secondary | ICD-10-CM | POA: Insufficient documentation

## 2013-11-03 DIAGNOSIS — R51 Headache: Secondary | ICD-10-CM | POA: Insufficient documentation

## 2013-11-03 DIAGNOSIS — J40 Bronchitis, not specified as acute or chronic: Secondary | ICD-10-CM | POA: Insufficient documentation

## 2013-11-03 DIAGNOSIS — Z8719 Personal history of other diseases of the digestive system: Secondary | ICD-10-CM | POA: Insufficient documentation

## 2013-11-03 DIAGNOSIS — Z720 Tobacco use: Secondary | ICD-10-CM | POA: Insufficient documentation

## 2013-11-03 DIAGNOSIS — R42 Dizziness and giddiness: Secondary | ICD-10-CM | POA: Insufficient documentation

## 2013-11-03 DIAGNOSIS — G8929 Other chronic pain: Secondary | ICD-10-CM | POA: Insufficient documentation

## 2013-11-03 DIAGNOSIS — J9801 Acute bronchospasm: Secondary | ICD-10-CM | POA: Insufficient documentation

## 2013-11-03 DIAGNOSIS — J209 Acute bronchitis, unspecified: Secondary | ICD-10-CM

## 2013-11-03 DIAGNOSIS — R5383 Other fatigue: Secondary | ICD-10-CM | POA: Insufficient documentation

## 2013-11-03 MED ORDER — ALBUTEROL SULFATE HFA 108 (90 BASE) MCG/ACT IN AERS: 1.0000 | INHALATION_SPRAY | Freq: Four times a day (QID) | RESPIRATORY_TRACT | Status: DC | PRN

## 2013-11-03 MED ORDER — IPRATROPIUM BROMIDE 0.02 % IN SOLN
1.0000 mg | Freq: Once | RESPIRATORY_TRACT | Status: AC
  Administered 2013-11-03: 1 mg via RESPIRATORY_TRACT
  Filled 2013-11-03: qty 5

## 2013-11-03 MED ORDER — GUAIFENESIN 100 MG/5ML PO SYRP
100.0000 mg | ORAL_SOLUTION | ORAL | Status: DC | PRN
Start: 2013-11-03 — End: 2016-11-28

## 2013-11-03 MED ORDER — PREDNISONE 50 MG PO TABS
60.0000 mg | ORAL_TABLET | Freq: Once | ORAL | Status: AC
  Administered 2013-11-03: 60 mg via ORAL
  Filled 2013-11-03 (×2): qty 1

## 2013-11-03 MED ORDER — ALBUTEROL SULFATE (2.5 MG/3ML) 0.083% IN NEBU
10.0000 mg | INHALATION_SOLUTION | Freq: Once | RESPIRATORY_TRACT | Status: AC
  Administered 2013-11-03: 10 mg via RESPIRATORY_TRACT
  Filled 2013-11-03: qty 12

## 2013-11-03 MED ORDER — IPRATROPIUM-ALBUTEROL 0.5-2.5 (3) MG/3ML IN SOLN
3.0000 mL | Freq: Once | RESPIRATORY_TRACT | Status: AC
  Administered 2013-11-03: 3 mL via RESPIRATORY_TRACT
  Filled 2013-11-03: qty 3

## 2013-11-03 MED ORDER — FLUTICASONE PROPIONATE HFA 110 MCG/ACT IN AERO: 1.0000 | INHALATION_SPRAY | Freq: Two times a day (BID) | RESPIRATORY_TRACT | Status: DC

## 2013-11-03 NOTE — ED Notes (Signed)
Pt alert & oriented x4, stable gait. Patient given discharge instructions, paperwork & prescription(s). Patient  instructed to stop at the registration desk to finish any additional paperwork. Patient verbalized understanding. Pt left department w/ no further questions. 

## 2013-11-03 NOTE — ED Notes (Signed)
Aching all over, cough, congestion x 4 days. Use OTC meds without relief.

## 2013-11-03 NOTE — ED Notes (Signed)
RT at the bedside.

## 2013-11-03 NOTE — ED Provider Notes (Signed)
Medical screening examination/treatment/procedure(s) were conducted as a shared visit with non-physician practitioner(s) and myself.  I personally evaluated the patient during the encounter.  Patient with minimal scattered wheezes after albuterol in the emergency department with pulse oximetry normal and speech is normal as well without pneumonia chest x-ray do not think the patient needs hospitalization patient is intolerant of steroids and prefers no oral steroid prescription due to intolerance with mood swings (anger) so he would be tried on steroid inhaler instead along with albuterol.   Hurman HornJohn M Trysta Showman, MD 11/05/13 [phone]

## 2013-11-03 NOTE — ED Notes (Signed)
Pt states still hurting & SOB after completing breathing tx.

## 2013-11-03 NOTE — Discharge Instructions (Signed)
Use the steroid inhaler as directed and use the Albuterol inhaler as needed. Follow up with your doctor or return here as needed.

## 2013-11-03 NOTE — ED Provider Notes (Signed)
CSN: [id number]     Arrival date & time 11/03/13  1629 History   First MD Initiated Contact with Patient 11/03/13 1656     Chief Complaint  Patient presents with  . Cough     (Consider location/radiation/quality/duration/timing/severity/associated sxs/prior Treatment) Patient is a 43 y.o. male presenting with URI. The history is provided by the patient.  URI Presenting symptoms: congestion, cough, fatigue and sore throat   Presenting symptoms: no ear pain   Severity:  Moderate Onset quality:  Gradual Duration:  5 days Timing:  Constant Progression:  Worsening Chronicity:  New Relieved by:  Nothing Worsened by:  Nothing tried Ineffective treatments:  OTC medications, hot fluids and decongestant Associated symptoms: headaches, myalgias and wheezing    John Clark is a 43 y.o. male who presents to the ED with cough, congestion, fever and chills that started 5 days ago. He has been taking OTC cough and congestion medication without relief.  Past Medical History  Diagnosis Date  . Irregular heartbeat   . Chronic back pain   . Chronic lumbar pain   . GERD (gastroesophageal reflux disease)   . Rectal polyp   . Degenerative disc disease    Past Surgical History  Procedure Laterality Date  . Laceration repair      repair to right arm that severed artery  . Arm surgery    . Groin exploration    . Cholecystectomy     Family History  Problem Relation Age of Onset  . Alzheimer's disease Other    History  Substance Use Topics  . Smoking status: Current Every Day Smoker -- 1.00 packs/day    Types: Cigarettes  . Smokeless tobacco: Never Used  . Alcohol Use: No    Review of Systems  Constitutional: Positive for chills and fatigue.  HENT: Positive for congestion, sinus pressure and sore throat. Negative for ear pain and trouble swallowing.   Eyes: Negative for pain, redness and visual disturbance.  Respiratory: Positive for cough and wheezing.   Gastrointestinal:  Negative for nausea, vomiting and abdominal pain.  Genitourinary: Negative for dysuria, urgency and frequency.  Musculoskeletal: Positive for myalgias.  Skin: Negative for rash.  Neurological: Positive for light-headedness and headaches. Negative for syncope.  Psychiatric/Behavioral: Negative for confusion. The patient is not nervous/anxious.       Allergies  Codeine; Ibuprofen; Ketorolac tromethamine; and Nsaids  Home Medications   Prior to Admission medications   Medication Sig Start Date End Date Taking? Authorizing Provider  acetaminophen (TYLENOL) 500 MG tablet Take 1,000 mg by mouth every 6 (six) hours as needed for mild pain or moderate pain.     Historical Provider, MD  benzocaine (ORAJEL) 10 % mucosal gel Use as directed 1 application in the mouth or throat as needed for mouth pain.    Historical Provider, MD  HYDROcodone-acetaminophen (NORCO/VICODIN) 5-325 MG per tablet Take 1 tablet by mouth every 6 (six) hours as needed for moderate pain. 10/07/13   Jamesetta Orleans Lawyer, PA-C  traMADol (ULTRAM) 50 MG tablet Take 1 tablet (50 mg total) by mouth every 6 (six) hours as needed. 09/14/13   Gilda Crease, MD   BP 133/80  Pulse 75  Temp(Src) 98.1 F (36.7 C) (Oral)  Resp 20  Ht 5\' 9"  (1.753 m)  Wt 225 lb (102.059 kg)  BMI 33.21 kg/m2  SpO2 98% Physical Exam  Nursing note and vitals reviewed. Constitutional: He is oriented to person, place, and time. He appears well-developed and well-nourished. No distress.  HENT:  Head: Normocephalic and atraumatic.  Right Ear: Tympanic membrane normal.  Left Ear: Tympanic membrane normal.  Nose: Rhinorrhea present.  Mouth/Throat: Uvula is midline, oropharynx is clear and moist and mucous membranes are normal.  Eyes: Conjunctivae and EOM are normal. Pupils are equal, round, and reactive to light.  Neck: Neck supple.  Cardiovascular: Normal rate and regular rhythm.   Pulmonary/Chest: Effort normal. He has decreased breath sounds.  He has wheezes.  Abdominal: Soft. Bowel sounds are normal. There is no tenderness.  Musculoskeletal: Normal range of motion.  Neurological: He is alert and oriented to person, place, and time. No cranial nerve deficit.  Skin: Skin is warm and dry.  Psychiatric: He has a normal mood and affect. His behavior is normal.    ED Course  Procedures (including critical care time) Labs Review Dg Chest 2 View  11/03/2013   CLINICAL DATA:  Cough and fever for 5 days with shortness of breath and generalized body aches. Current smoker.  EXAM: CHEST  2 VIEW  COMPARISON:  09/08/2013  FINDINGS: The heart size and mediastinal contours are within normal limits. Both lungs are clear. Mild degenerative change of the spine.  IMPRESSION: No active cardiopulmonary disease.   Electronically Signed   By: Elberta Fortisaniel  Boyle M.D.   On: 11/03/2013 18:17    After breathing treatment the patient continues to have wheezing throughout his lungs. Respiratory in to start continuous neb treatment.   MDM  The patient took one dose of oral steroid here Prednisone 60 mg. But states that he gets very ill and is afraid he may hurt someone when he takes it. Dr. Fonnie JarvisBednar in to examine the patient. Will treat with inhaled steroid rather than oral and with albuterol inhaler.  43 y.o. male with cough and wheezing and flu like symptoms x 5 days. After continuous neb treatment his breathing has improved. Stable for discharge with BP 133/80  Pulse 75  Temp(Src) 98.1 F (36.7 C) (Oral)  Resp 20  Ht 5\' 9"  (1.753 m)  Wt 225 lb (102.059 kg)  BMI 33.21 kg/m2  SpO2 98%  I have reviewed this patient's vital signs, nurses notes, appropriate labs and imaging.  I have discussed findings with the patient and plan of care. He voices understanding and agrees with plan.    Medication List    TAKE these medications       albuterol 108 (90 BASE) MCG/ACT inhaler  Commonly known as:  PROVENTIL HFA;VENTOLIN HFA  Inhale 1-2 puffs into the lungs every 6  (six) hours as needed for wheezing or shortness of breath.     fluticasone 110 MCG/ACT inhaler  Commonly known as:  FLOVENT HFA  Inhale 1 puff into the lungs 2 (two) times daily.     guaifenesin 100 MG/5ML syrup  Commonly known as:  ROBITUSSIN  Take 5-10 mLs (100-200 mg total) by mouth every 4 (four) hours as needed for cough or congestion.      ASK your doctor about these medications       acetaminophen 500 MG tablet  Commonly known as:  TYLENOL  Take 1,000 mg by mouth every 6 (six) hours as needed for mild pain or moderate pain.     benzocaine 10 % mucosal gel  Commonly known as:  ORAJEL  Use as directed 1 application in the mouth or throat as needed for mouth pain.     HYDROcodone-acetaminophen 5-325 MG per tablet  Commonly known as:  NORCO/VICODIN  Take 1 tablet by mouth  every 6 (six) hours as needed for moderate pain.     traMADol 50 MG tablet  Commonly known as:  ULTRAM  Take 1 tablet (50 mg total) by mouth every 6 (six) hours as needed.           [place], [place] 11/03/13 2109

## 2013-11-03 NOTE — ED Notes (Signed)
Pt completed duoneb, continues to wheeze, PA aware orders given.

## 2013-11-03 NOTE — ED Notes (Signed)
Chest congestion, head stopped up and   Achy since Tuesday.

## 2013-12-24 ENCOUNTER — Emergency Department (HOSPITAL_COMMUNITY)
Admission: EM | Admit: 2013-12-24 | Discharge: 2013-12-25 | Disposition: A | Discharge status: Home / Self Care | Payer: Self-pay | Attending: Emergency Medicine | Admitting: Emergency Medicine

## 2013-12-24 ENCOUNTER — Encounter (HOSPITAL_COMMUNITY): Payer: Self-pay

## 2013-12-24 DIAGNOSIS — Z79899 Other long term (current) drug therapy: Secondary | ICD-10-CM | POA: Insufficient documentation

## 2013-12-24 DIAGNOSIS — K922 Gastrointestinal hemorrhage, unspecified: Secondary | ICD-10-CM | POA: Insufficient documentation

## 2013-12-24 DIAGNOSIS — Z7952 Long term (current) use of systemic steroids: Secondary | ICD-10-CM | POA: Insufficient documentation

## 2013-12-24 DIAGNOSIS — Z86010 Personal history of colonic polyps: Secondary | ICD-10-CM | POA: Insufficient documentation

## 2013-12-24 DIAGNOSIS — Z9089 Acquired absence of other organs: Secondary | ICD-10-CM | POA: Insufficient documentation

## 2013-12-24 DIAGNOSIS — Z8739 Personal history of other diseases of the musculoskeletal system and connective tissue: Secondary | ICD-10-CM | POA: Insufficient documentation

## 2013-12-24 DIAGNOSIS — G8929 Other chronic pain: Secondary | ICD-10-CM | POA: Insufficient documentation

## 2013-12-24 DIAGNOSIS — H109 Unspecified conjunctivitis: Secondary | ICD-10-CM | POA: Insufficient documentation

## 2013-12-24 DIAGNOSIS — R109 Unspecified abdominal pain: Secondary | ICD-10-CM

## 2013-12-24 DIAGNOSIS — Z720 Tobacco use: Secondary | ICD-10-CM | POA: Insufficient documentation

## 2013-12-24 DIAGNOSIS — R42 Dizziness and giddiness: Secondary | ICD-10-CM | POA: Insufficient documentation

## 2013-12-24 LAB — CBC
HCT: 44.4 % (ref 39.0–52.0)
HEMOGLOBIN: 15.7 g/dL (ref 13.0–17.0)
MCH: 32.5 pg (ref 26.0–34.0)
MCHC: 35.4 g/dL (ref 30.0–36.0)
MCV: 91.9 fL (ref 78.0–100.0)
Platelets: 217 10*3/uL (ref 150–400)
RBC: 4.83 MIL/uL (ref 4.22–5.81)
RDW: 12.9 % (ref 11.5–15.5)
WBC: 15.1 10*3/uL — ABNORMAL HIGH (ref 4.0–10.5)

## 2013-12-24 LAB — COMPREHENSIVE METABOLIC PANEL
ALT: 30 U/L (ref 0–53)
ANION GAP: 15 (ref 5–15)
AST: 24 U/L (ref 0–37)
Albumin: 4 g/dL (ref 3.5–5.2)
Alkaline Phosphatase: 73 U/L (ref 39–117)
BILIRUBIN TOTAL: 0.2 mg/dL — AB (ref 0.3–1.2)
BUN: 18 mg/dL (ref 6–23)
CALCIUM: 9.3 mg/dL (ref 8.4–10.5)
CHLORIDE: 99 meq/L (ref 96–112)
CO2: 26 mEq/L (ref 19–32)
CREATININE: 0.98 mg/dL (ref 0.50–1.35)
GFR calc Af Amer: >90 mL/min (ref >90)
GFR calc non Af Amer: >90 mL/min (ref >90)
Glucose, Bld: 118 mg/dL — ABNORMAL HIGH (ref 70–99)
POTASSIUM: 3.4 meq/L — AB (ref 3.7–5.3)
SODIUM: 140 meq/L (ref 137–147)
Total Protein: 7.2 g/dL (ref 6.0–8.3)

## 2013-12-24 MED ORDER — ONDANSETRON HCL 4 MG/2ML IJ SOLN
4.0000 mg | Freq: Once | INTRAMUSCULAR | Status: AC
Start: 2013-12-24 — End: 2013-12-24
  Administered 2013-12-24: 4 mg via INTRAVENOUS
  Filled 2013-12-24: qty 2

## 2013-12-24 NOTE — ED Notes (Signed)
Patient states he started passing blood through his stool a week ago, patient states over the past few days it has become more frequent with bright red blood. Patient states stomach pains with nausea and dry heaves

## 2013-12-25 ENCOUNTER — Emergency Department (HOSPITAL_COMMUNITY): Payer: Self-pay

## 2013-12-25 LAB — POC OCCULT BLOOD, ED: Fecal Occult Bld: POSITIVE — AB

## 2013-12-25 LAB — TYPE AND SCREEN
ABO/RH(D): O POS
ANTIBODY SCREEN: NEGATIVE

## 2013-12-25 MED ORDER — ONDANSETRON HCL 4 MG/2ML IJ SOLN
4.0000 mg | Freq: Once | INTRAMUSCULAR | Status: AC
  Administered 2013-12-25: 4 mg via INTRAVENOUS
  Filled 2013-12-25: qty 2

## 2013-12-25 MED ORDER — MORPHINE SULFATE 4 MG/ML IJ SOLN
4.0000 mg | Freq: Once | INTRAMUSCULAR | Status: AC
  Administered 2013-12-25: 4 mg via INTRAVENOUS
  Filled 2013-12-25: qty 1

## 2013-12-25 MED ORDER — SODIUM CHLORIDE 0.9 % IV BOLUS (SEPSIS)
1000.0000 mL | Freq: Once | INTRAVENOUS | Status: AC
  Administered 2013-12-25: 1000 mL via INTRAVENOUS

## 2013-12-25 MED ORDER — PANTOPRAZOLE SODIUM 40 MG IV SOLR
40.0000 mg | Freq: Once | INTRAVENOUS | Status: AC
  Administered 2013-12-25: 40 mg via INTRAVENOUS
  Filled 2013-12-25: qty 40

## 2013-12-25 MED ORDER — IOHEXOL 300 MG/ML  SOLN
100.0000 mL | Freq: Once | INTRAMUSCULAR | Status: AC | PRN
  Administered 2013-12-25: 100 mL via INTRAVENOUS

## 2013-12-25 MED ORDER — HYDROMORPHONE HCL 1 MG/ML IJ SOLN
1.0000 mg | Freq: Once | INTRAMUSCULAR | Status: AC
Start: 2013-12-25 — End: 2013-12-25
  Administered 2013-12-25: 1 mg via INTRAVENOUS
  Filled 2013-12-25: qty 1

## 2013-12-25 MED ORDER — IOHEXOL 300 MG/ML  SOLN
25.0000 mL | Freq: Once | INTRAMUSCULAR | Status: AC | PRN
  Administered 2013-12-25: 25 mL via ORAL

## 2013-12-25 MED ORDER — ONDANSETRON HCL 4 MG/2ML IJ SOLN: 4.0000 mg | Freq: Once | INTRAMUSCULAR | Status: DC

## 2013-12-25 NOTE — ED Notes (Signed)
MD at bedside. 

## 2013-12-25 NOTE — ED Provider Notes (Signed)
CSN: [id number]     Arrival date & time 12/24/13  2039 History   First MD Initiated Contact with Patient 12/24/13 2355     Chief Complaint  Patient presents with  . Rectal Bleeding     (Consider location/radiation/quality/duration/timing/severity/associated sxs/prior Treatment) The history is provided by the patient.   John Clark is a 43 y.o. male presenting with a 1 week history of rectal bleeding.  He describes noting bright red blood on the toilet tissue and in the toilet bowl with bowel movements which has worsened over the past 3 days and now including abdominal pain and cramping which gets severe prior to passage of blood.  He has had 4 episodes of a large amount of blood mixed with clots today, most recently since arrival here.  He reports constant generalized abdominal pain at present.  He denies rectal pain, history of diverticulosis, hemorrhoids or intestinal inflammatory conditions.  He has had no recent travel.  He denies feeling febrile.  He is nauseated and has had several episodes of dry heaves, but has had no PO intake in the past 2 days. He reports increasing weakness and lightheadedness when he stands too quickly.  His past medical history is significant for GERD.  He denies PUD, denies taking nsaids, and does not consume etoh.  He has found no alleviators for his symtpoms.  No known family history of IBD.      Past Medical History  Diagnosis Date  . Irregular heartbeat   . Chronic back pain   . Chronic lumbar pain   . GERD (gastroesophageal reflux disease)   . Rectal polyp   . Degenerative disc disease    Past Surgical History  Procedure Laterality Date  . Laceration repair      repair to right arm that severed artery  . Arm surgery    . Groin exploration    . Cholecystectomy     Family History  Problem Relation Age of Onset  . Alzheimer's disease Other    History  Substance Use Topics  . Smoking status: Current Every Day Smoker -- 1.00 packs/day   Types: Cigarettes  . Smokeless tobacco: Never Used  . Alcohol Use: No    Review of Systems  Constitutional: Positive for fatigue. Negative for fever.  HENT: Negative for congestion and sore throat.   Eyes: Negative.   Respiratory: Negative for chest tightness and shortness of breath.   Cardiovascular: Negative for chest pain.  Gastrointestinal: Positive for nausea, vomiting and abdominal pain.  Genitourinary: Negative.   Musculoskeletal: Negative for joint swelling, arthralgias and neck pain.  Skin: Negative.  Negative for rash and wound.  Neurological: Positive for light-headedness. Negative for dizziness, weakness, numbness and headaches.  Psychiatric/Behavioral: Negative.       Allergies  Codeine; Ibuprofen; Ketorolac tromethamine; Nsaids; and Prednisone  Home Medications   Prior to Admission medications   Medication Sig Start Date End Date Taking? Authorizing Provider  acetaminophen (TYLENOL) 500 MG tablet Take 1,000 mg by mouth every 6 (six) hours as needed for mild pain or moderate pain.     Historical Provider, MD  albuterol (PROVENTIL HFA;VENTOLIN HFA) 108 (90 BASE) MCG/ACT inhaler Inhale 1-2 puffs into the lungs every 6 (six) hours as needed for wheezing or shortness of breath. 11/03/13   Hope Orlene OchM Neese, NP  benzocaine (ORAJEL) 10 % mucosal gel Use as directed 1 application in the mouth or throat as needed for mouth pain.    Historical Provider, MD  fluticasone (FLOVENT HFA)  110 MCG/ACT inhaler Inhale 1 puff into the lungs 2 (two) times daily. 11/03/13   Hope Orlene OchM Neese, NP  guaifenesin (ROBITUSSIN) 100 MG/5ML syrup Take 5-10 mLs (100-200 mg total) by mouth every 4 (four) hours as needed for cough or congestion. 11/03/13   Hope Orlene OchM Neese, NP  HYDROcodone-acetaminophen (NORCO/VICODIN) 5-325 MG per tablet Take 1 tablet by mouth every 6 (six) hours as needed for moderate pain. 10/07/13   Jamesetta Orleanshristopher W Lawyer, PA-C  traMADol (ULTRAM) 50 MG tablet Take 1 tablet (50 mg total) by mouth every  6 (six) hours as needed. 09/14/13   Gilda Creasehristopher J. Pollina, MD   BP 130/91 mmHg  Pulse 87  Temp(Src) 98.5 F (36.9 C) (Oral)  Resp 14  Ht 5\' 11"  (1.803 m)  Wt 231 lb (104.781 kg)  BMI 32.23 kg/m2  SpO2 96% Physical Exam  Constitutional: He appears well-developed and well-nourished.  HENT:  Head: Normocephalic and atraumatic.  Eyes: Conjunctivae are normal.  Mild conjunctival pallor.  Neck: Normal range of motion.  Cardiovascular: Normal rate, regular rhythm, normal heart sounds and intact distal pulses.   Pulmonary/Chest: Effort normal and breath sounds normal. He has no wheezes.  Abdominal: Soft. Bowel sounds are normal. He exhibits distension. There is tenderness. There is no rebound and no guarding.  No increased tympany to percussion.  Normal bowel sounds.  Genitourinary: Rectal exam shows tenderness. Rectal exam shows no external hemorrhoid, no internal hemorrhoid and anal tone normal. Guaiac positive stool.  Pt's rectal exam was not grossly bloody, but hemoccult was strong positive.  Musculoskeletal: Normal range of motion.  Neurological: He is alert.  Skin: Skin is warm and dry.  Psychiatric: He has a normal mood and affect.  Nursing note and vitals reviewed.   ED Course  Procedures (including critical care time) Labs Review Labs Reviewed  CBC - Abnormal; Notable for the following:    WBC 15.1 (*)    All other components within normal limits  COMPREHENSIVE METABOLIC PANEL - Abnormal; Notable for the following:    Potassium 3.4 (*)    Glucose, Bld 118 (*)    Total Bilirubin 0.2 (*)    All other components within normal limits  POC OCCULT BLOOD, ED - Abnormal; Notable for the following:    Fecal Occult Bld POSITIVE (*)    All other components within normal limits  POC OCCULT BLOOD, ED  TYPE AND SCREEN    Imaging Review No results found.   EKG Interpretation None      MDM   Final diagnoses:  Abdominal pain  Lower GI bleeding    Plan for patient  admission once CT results for further tx and management of acute GIB.  Discussed with Dr Judd Lienelo who will dispo once CT completed.    Burgess AmorJulie Teddie Mehta, PA-C 12/25/13 [id number]  Geoffery Lyonsouglas Delo, MD 12/25/13 [id number]

## 2013-12-25 NOTE — Discharge Instructions (Signed)
Follow-up with the gastroenterology clinic to discuss the possibility of a colonoscopy. The contact information for Dr. Karilyn Cotaehman has been provided in this discharge summary. Call tomorrow to arrange this appointment.   Bloody Stools Bloody stools often mean that there is a problem in the digestive tract. Your caregiver may use the term "melena" to describe black, tarry, and bad smelling stools or "hematochezia" to describe red or maroon-colored stools. Blood seen in the stool can be caused by bleeding anywhere along the intestinal tract.  A black stool usually means that blood is coming from the upper part of the gastrointestinal tract (esophagus, stomach, or small bowel). Passing maroon-colored stools or bright red blood usually means that blood is coming from lower down in the large bowel or the rectum. However, sometimes massive bleeding in the stomach or small intestine can cause bright red bloody stools.  Consuming black licorice, lead, iron pills, medicines containing bismuth subsalicylate, or blueberries can also cause black stools. Your caregiver can test black stools to see if blood is present. It is important that the cause of the bleeding be found. Treatment can then be started, and the problem can be corrected. Rectal bleeding may not be serious, but you should not assume everything is okay until you know the cause.It is very important to follow up with your caregiver or a specialist in gastrointestinal problems. CAUSES  Blood in the stools can come from various underlying causes.Often, the cause is not found during your first visit. Testing is often needed to discover the cause of bleeding in the gastrointestinal tract. Causes range from simple to serious or even life-threatening.Possible causes include:  Hemorrhoids.These are veins that are full of blood (engorged) in the rectum. They cause pain, inflammation, and may bleed.  Anal fissures.These are areas of painful tearing which may  bleed. They are often caused by passing hard stool.  Diverticulosis.These are pouches that form on the colon over time, with age, and may bleed significantly.  Diverticulitis.This is inflammation in areas with diverticulosis. It can cause pain, fever, and bloody stools, although bleeding is rare.  Proctitis and colitis. These are inflamed areas of the rectum or colon. They may cause pain, fever, and bloody stools.  Polyps and cancer. Colon cancer is a leading cause of preventable cancer death.It often starts out as precancerous polyps that can be removed during a colonoscopy, preventing progression into cancer. Sometimes, polyps and cancer may cause rectal bleeding.  Gastritis and ulcers.Bleeding from the upper gastrointestinal tract (near the stomach) may travel through the intestines and produce black, sometimes tarry, often bad smelling stools. In certain cases, if the bleeding is fast enough, the stools may not be black, but red and the condition may be life-threatening. SYMPTOMS  You may have stools that are bright red and bloody, that are normal color with blood on them, or that are dark black and tarry. In some cases, you may only have blood in the toilet bowl. Any of these cases need medical care. You may also have:  Pain at the anus or anywhere in the rectum.  Lightheadedness or feeling faint.  Extreme weakness.  Nausea or vomiting.  Fever. DIAGNOSIS Your caregiver may use the following methods to find the cause of your bleeding:  Taking a medical history. Age is important. Older people tend to develop polyps and cancer more often. If there is anal pain and a hard, large stool associated with bleeding, a tear of the anus may be the cause. If blood drips into the  toilet after a bowel movement, bleeding hemorrhoids may be the problem. The color and frequency of the bleeding are additional considerations. In most cases, the medical history provides clues, but seldom the final  answer.  A visual and finger (digital) exam. Your caregiver will inspect the anal area, looking for tears and hemorrhoids. A finger exam can provide information when there is tenderness or a growth inside. In men, the prostate is also examined.  Endoscopy. Several types of small, long scopes (endoscopes) are used to view the colon.  In the office, your caregiver may use a rigid, or more commonly, a flexible viewing sigmoidoscope. This exam is called flexible sigmoidoscopy. It is performed in 5 to 10 minutes.  A more thorough exam is accomplished with a colonoscope. It allows your caregiver to view the entire 5 to 6 foot long colon. Medicine to help you relax (sedative) is usually given for this exam. Frequently, a bleeding lesion may be present beyond the reach of the sigmoidoscope. So, a colonoscopy may be the best exam to start with. Both exams are usually done on an outpatient basis. This means the patient does not stay overnight in the hospital or surgery center.  An upper endoscopy may be needed to examine your stomach. Sedation is used and a flexible endoscope is put in your mouth, down to your stomach.  A barium enema X-ray. This is an X-ray exam. It uses liquid barium inserted by enema into the rectum. This test alone may not identify an actual bleeding point. X-rays highlight abnormal shadows, such as those made by lumps (tumors), diverticuli, or colitis. TREATMENT  Treatment depends on the cause of your bleeding.   For bleeding from the stomach or colon, the caregiver doing your endoscopy or colonoscopy may be able to stop the bleeding as part of the procedure.  Inflammation or infection of the colon can be treated with medicines.  Many rectal problems can be treated with creams, suppositories, or warm baths.  Surgery is sometimes needed.  Blood transfusions are sometimes needed if you have lost a lot of blood.  For any bleeding problem, let your caregiver know if you take aspirin  or other blood thinners regularly. HOME CARE INSTRUCTIONS   Take any medicines exactly as prescribed.  Keep your stools soft by eating a diet high in fiber. Prunes (1 to 3 a day) work well for many people.  Drink enough water and fluids to keep your urine clear or pale yellow.  Take sitz baths if advised. A sitz bath is when you sit in a bathtub with warm water for 10 to 15 minutes to soak, soothe, and cleanse the rectal area.  If enemas or suppositories are advised, be sure you know how to use them. Tell your caregiver if you have problems with this.  Monitor your bowel movements to look for signs of improvement or worsening. SEEK MEDICAL CARE IF:   You do not improve in the time expected.  Your condition worsens after initial improvement.  You develop any new symptoms. SEEK IMMEDIATE MEDICAL CARE IF:   You develop severe or prolonged rectal bleeding.  You vomit blood.  You feel weak or faint.  You have a fever. MAKE SURE YOU:  Understand these instructions.  Will watch your condition.  Will get help right away if you are not doing well or get worse. Document Released: 01/08/2002 Document Revised: 04/12/2011 Document Reviewed: 06/05/2010 [facility] Patient Information 2015 [employer] [place]. This information is not intended to replace advice given  to you by your health care provider. Make sure you discuss any questions you have with your health care provider.

## 2014-01-07 ENCOUNTER — Emergency Department: Payer: Self-pay | Admitting: Emergency Medicine

## 2014-04-01 ENCOUNTER — Emergency Department (HOSPITAL_COMMUNITY)
Admission: EM | Admit: 2014-04-01 | Discharge: 2014-04-01 | Disposition: A | Discharge status: Home / Self Care | Payer: Self-pay | Attending: Emergency Medicine | Admitting: Emergency Medicine

## 2014-04-01 ENCOUNTER — Encounter (HOSPITAL_COMMUNITY): Payer: Self-pay | Admitting: Emergency Medicine

## 2014-04-01 DIAGNOSIS — Z8679 Personal history of other diseases of the circulatory system: Secondary | ICD-10-CM | POA: Insufficient documentation

## 2014-04-01 DIAGNOSIS — G8929 Other chronic pain: Secondary | ICD-10-CM | POA: Insufficient documentation

## 2014-04-01 DIAGNOSIS — K0889 Other specified disorders of teeth and supporting structures: Secondary | ICD-10-CM

## 2014-04-01 DIAGNOSIS — K088 Other specified disorders of teeth and supporting structures: Secondary | ICD-10-CM | POA: Insufficient documentation

## 2014-04-01 DIAGNOSIS — Z7901 Long term (current) use of anticoagulants: Secondary | ICD-10-CM | POA: Insufficient documentation

## 2014-04-01 DIAGNOSIS — Z79899 Other long term (current) drug therapy: Secondary | ICD-10-CM | POA: Insufficient documentation

## 2014-04-01 DIAGNOSIS — Z720 Tobacco use: Secondary | ICD-10-CM | POA: Insufficient documentation

## 2014-04-01 MED ORDER — OXYCODONE-ACETAMINOPHEN 5-325 MG PO TABS: 1.0000 | ORAL_TABLET | Freq: Four times a day (QID) | ORAL | Status: DC | PRN

## 2014-04-01 MED ORDER — OXYCODONE-ACETAMINOPHEN 5-325 MG PO TABS
1.0000 | ORAL_TABLET | Freq: Once | ORAL | Status: DC
  Filled 2014-04-01: qty 1

## 2014-04-01 NOTE — ED Notes (Signed)
Patient c/o left sided upper and lower tooth pain; states has a dental appointment on Thursday.

## 2014-04-01 NOTE — Discharge Instructions (Signed)
Followup with your dentist as planned this week.

## 2014-04-01 NOTE — ED Provider Notes (Signed)
CSN: [id number]     Arrival date & time 04/01/14  0414 History   First MD Initiated Contact with Patient 04/01/14 0701     Chief Complaint  Patient presents with  . Dental Pain     (Consider location/radiation/quality/duration/timing/severity/associated sxs/prior Treatment) Patient is a 44 y.o. male presenting with tooth pain. The history is provided by the patient (the pt complains of a toothache).  Dental Pain Location:  Upper Upper teeth location: left upper molar. Quality:  Dull Onset quality:  Sudden Timing:  Constant Progression:  Worsening Chronicity:  New Context: not abscess   Associated symptoms: no congestion and no headaches     Past Medical History  Diagnosis Date  . Irregular heartbeat   . Chronic back pain   . Chronic lumbar pain   . GERD (gastroesophageal reflux disease)   . Rectal polyp   . Degenerative disc disease    Past Surgical History  Procedure Laterality Date  . Laceration repair      repair to right arm that severed artery  . Arm surgery    . Groin exploration    . Cholecystectomy     Family History  Problem Relation Age of Onset  . Alzheimer's disease Other    History  Substance Use Topics  . Smoking status: Current Every Day Smoker -- 1.00 packs/day    Types: Cigarettes  . Smokeless tobacco: Never Used  . Alcohol Use: No    Review of Systems  Constitutional: Negative for appetite change and fatigue.  HENT: Positive for dental problem. Negative for congestion, ear discharge and sinus pressure.   Eyes: Negative for discharge.  Respiratory: Negative for cough.   Cardiovascular: Negative for chest pain.  Gastrointestinal: Negative for abdominal pain and diarrhea.  Genitourinary: Negative for frequency and hematuria.  Musculoskeletal: Negative for back pain.  Skin: Negative for rash.  Neurological: Negative for seizures and headaches.  Psychiatric/Behavioral: Negative for hallucinations.      Allergies  Codeine; Ibuprofen;  Ketorolac tromethamine; Nsaids; and Prednisone  Home Medications   Prior to Admission medications   Medication Sig Start Date End Date Taking? Authorizing Provider  acetaminophen (TYLENOL) 500 MG tablet Take 1,000 mg by mouth every 6 (six) hours as needed for mild pain or moderate pain.    Yes Historical Provider, MD  albuterol (PROVENTIL HFA;VENTOLIN HFA) 108 (90 BASE) MCG/ACT inhaler Inhale 1-2 puffs into the lungs every 6 (six) hours as needed for wheezing or shortness of breath. 11/03/13  Yes Hope Orlene Och, NP  benzocaine (ORAJEL) 10 % mucosal gel Use as directed 1 application in the mouth or throat as needed for mouth pain.   Yes Historical Provider, MD  traMADol (ULTRAM) 50 MG tablet Take 1 tablet (50 mg total) by mouth every 6 (six) hours as needed. 09/14/13  Yes Gilda Crease, MD  fluticasone (FLOVENT HFA) 110 MCG/ACT inhaler Inhale 1 puff into the lungs 2 (two) times daily. 11/03/13   Hope Orlene Och, NP  guaifenesin (ROBITUSSIN) 100 MG/5ML syrup Take 5-10 mLs (100-200 mg total) by mouth every 4 (four) hours as needed for cough or congestion. 11/03/13   Hope Orlene Och, NP  HYDROcodone-acetaminophen (NORCO/VICODIN) 5-325 MG per tablet Take 1 tablet by mouth every 6 (six) hours as needed for moderate pain. 10/07/13   Jamesetta Orleans Lawyer, PA-C  oxyCODONE-acetaminophen (PERCOCET/ROXICET) 5-325 MG per tablet Take 1 tablet by mouth every 6 (six) hours as needed. 04/01/14   Benny Lennert, MD   BP 129/89 mmHg  Pulse  67  Temp(Src) 97.6 F (36.4 C) (Oral)  Resp 18  Ht 5\' 10"  (1.778 m)  Wt 230 lb (104.327 kg)  BMI 33.00 kg/m2  SpO2 99% Physical Exam  Constitutional: He is oriented to person, place, and time. He appears well-developed.  HENT:  Head: Normocephalic.  Tender left upper molar  Eyes: Conjunctivae are normal.  Neck: No tracheal deviation present.  Cardiovascular:  No murmur heard. Musculoskeletal: Normal range of motion.  Neurological: He is oriented to person, place, and  time.  Skin: Skin is warm.  Psychiatric: He has a normal mood and affect.    ED Course  Procedures (including critical care time) Labs Review Labs Reviewed - No data to display  Imaging Review No results found.   EKG Interpretation None      MDM   Final diagnoses:  Toothache    Toothache,  tx with penn and percocet and follow up    Benny LennertJoseph L Miamor Ayler, MD 04/01/14 [phone]

## 2014-07-02 IMAGING — CT CT ABD-PELV W/ CM
1 of 5 series · 3 of 36 positions shown, 4 images · IV contrast (Iodine)
Comparison: Radiographs 10/07/2012.

CLINICAL DATA: 6 foot fall from a ladder. Low back pain. History of
chronic low back pain.

EXAM:
CT CHEST, ABDOMEN, AND PELVIS WITH CONTRAST
TECHNIQUE: Multidetector CT imaging of the chest, abdomen and pelvis was
performed following the standard protocol during bolus
administration of intravenous contrast.
CONTRAST:  100mL OMNIPAQUE IOHEXOL 300 MG/ML  SOLN

[Series 206: coronals · coronal · 0.50mm/px · 3 of 114 slices shown, 4 images]
[im 23/114  mediastinal]
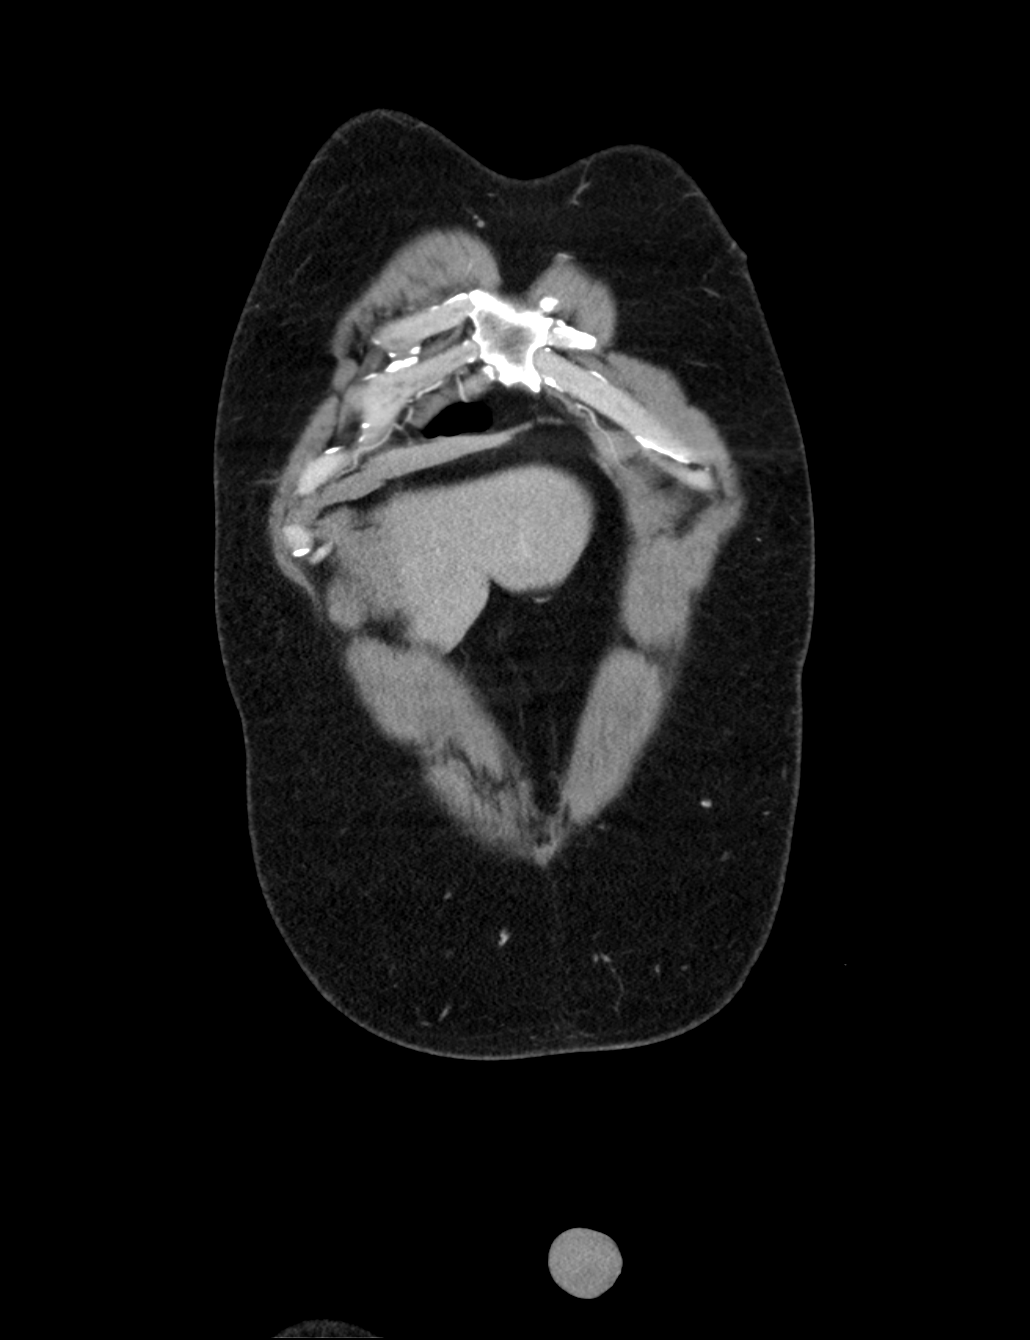
[im 23/114  lung]
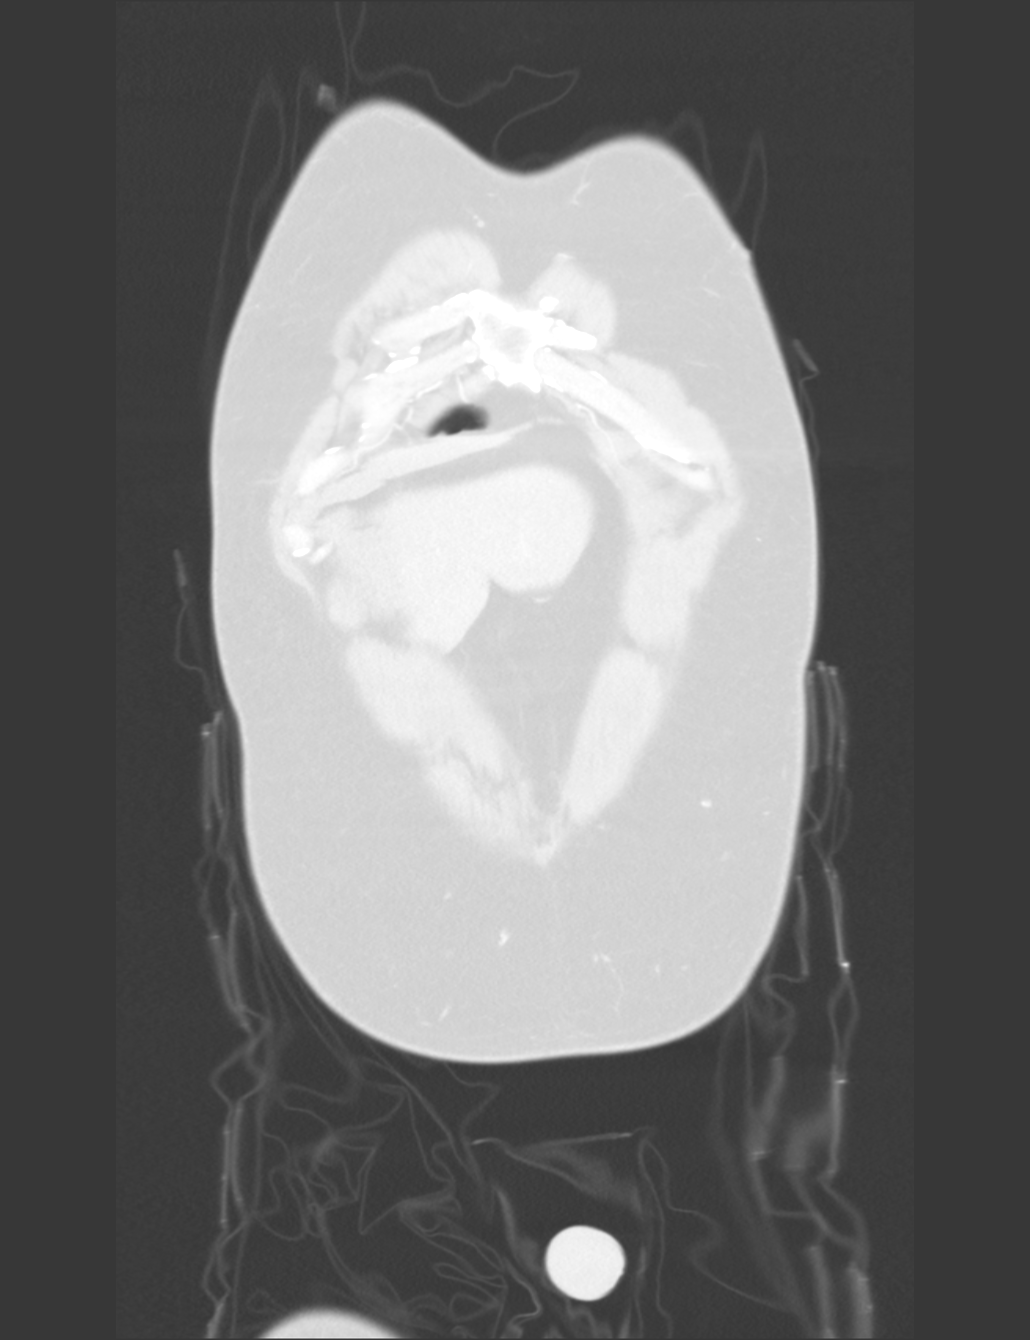
[im 68/114  lung]
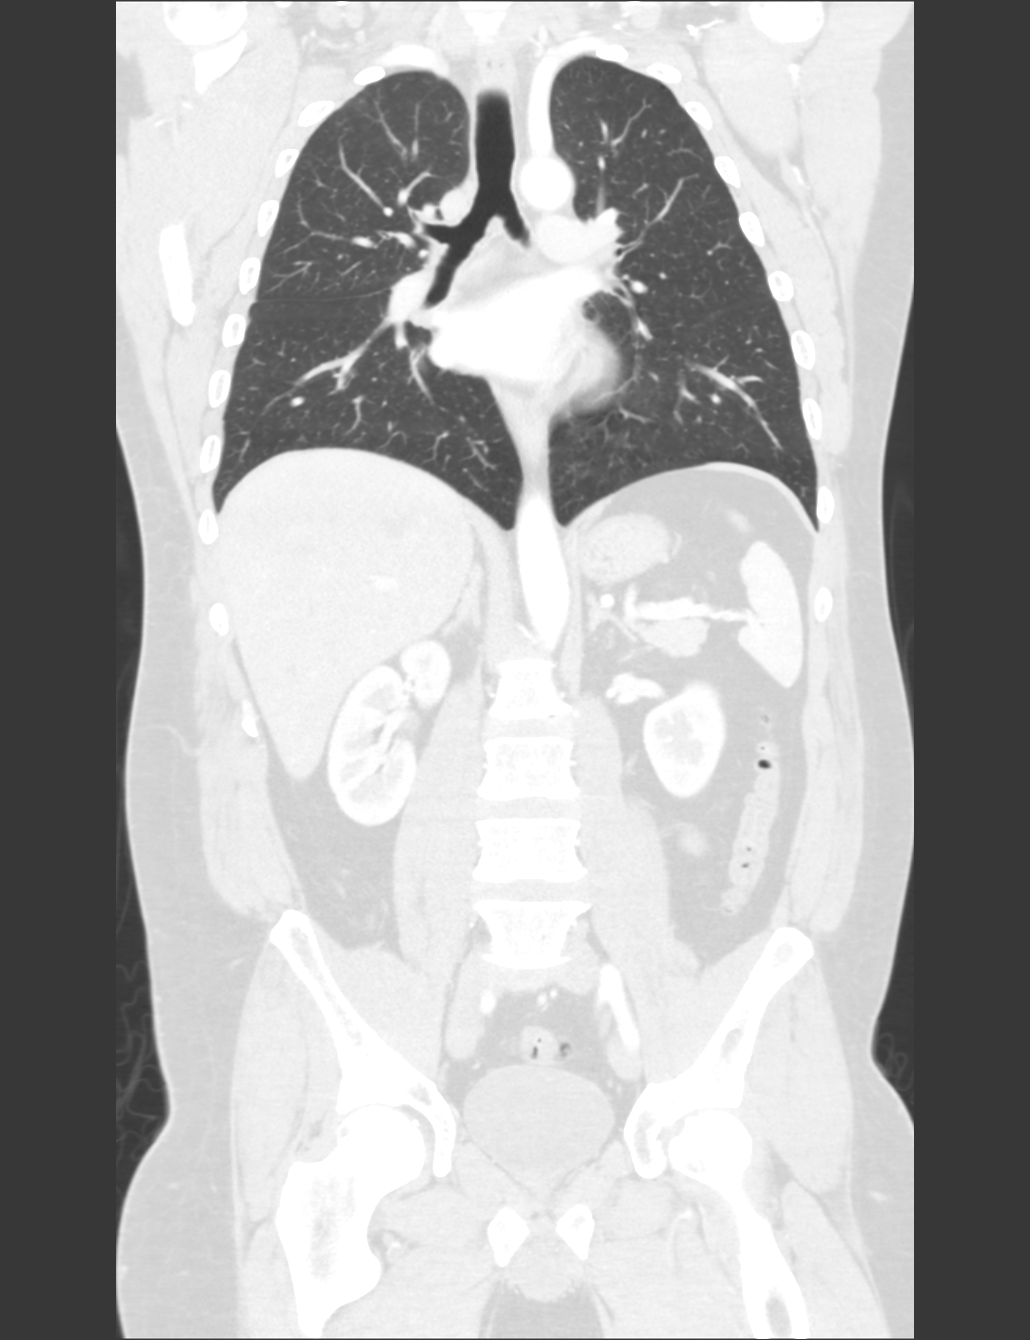
[im 91/114  lung]
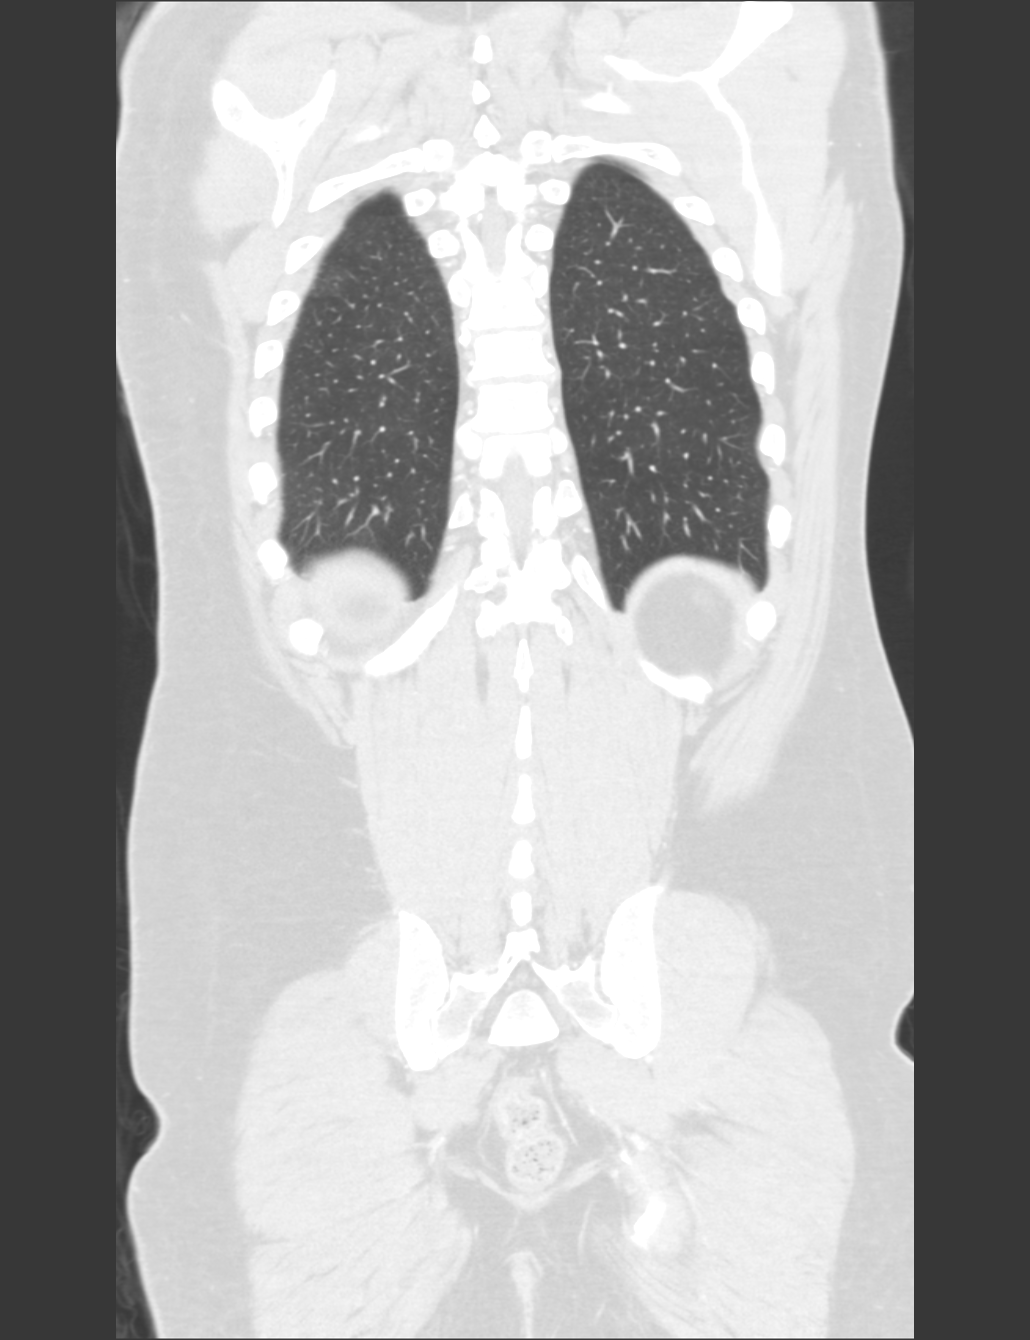

[3 of 36 positions shown; findings below may reference images not displayed]

FINDINGS: CT CHEST FINDINGS

Bones: Thoracic spinal alignment is within normal limits. Old
compression fractures of T7 and T8 are present. There is no
paravertebral phlegmon or evidence of an acute compression fracture.
The sternum is intact. No displaced rib fracture. Sternoclavicular
joints are within normal limits.

Lungs:  Clear.

Central airways: Patent.

Vasculature: Normal 3 vessel aortic arch. No acute vascular
abnormality.

Effusions: None.

Lymphadenopathy: None.

Esophagus: Normal.

Other: None.

CT ABDOMEN AND PELVIS FINDINGS

Bones: Lumbar spinal alignment and vertebral body height is within
normal limits. The pelvic rings are intact. Pubic symphysis and SI
joints within normal limits. Both hips are located.

Liver: Normal variant Riedel's lobe of the liver. No mass lesion or
biliary ductal dilation.

Spleen:  Normal.  Negative for splenic injury.

Gallbladder:  Normal.

Common bile duct:  Normal.

Pancreas:  Normal.

Adrenal glands:  Normal bilaterally.

Kidneys:  Normal enhancement and delayed excretion of contrast.

Stomach:  Normal.

Small bowel:  Normal.  No mesenteric adenopathy or hemorrhage.

Colon:   Normal appendix.  Colon within normal limits.

Pelvic Genitourinary:  Normal appearance of the urinary bladder.

Vasculature: Normal.

Body Wall: Normal.
IMPRESSION: No acute abnormality. No acute traumatic injury. Old mid to lower
thoracic compression fractures, unchanged.

## 2014-08-16 ENCOUNTER — Emergency Department (HOSPITAL_COMMUNITY)
Admission: EM | Admit: 2014-08-16 | Discharge: 2014-08-17 | Disposition: A | Discharge status: Home / Self Care | Payer: Self-pay | Attending: Emergency Medicine | Admitting: Emergency Medicine

## 2014-08-16 ENCOUNTER — Emergency Department (HOSPITAL_COMMUNITY): Payer: Self-pay

## 2014-08-16 ENCOUNTER — Encounter (HOSPITAL_COMMUNITY): Payer: Self-pay | Admitting: Oncology

## 2014-08-16 DIAGNOSIS — Z720 Tobacco use: Secondary | ICD-10-CM | POA: Insufficient documentation

## 2014-08-16 DIAGNOSIS — S99921A Unspecified injury of right foot, initial encounter: Secondary | ICD-10-CM

## 2014-08-16 DIAGNOSIS — M545 Low back pain, unspecified: Secondary | ICD-10-CM

## 2014-08-16 DIAGNOSIS — Y998 Other external cause status: Secondary | ICD-10-CM | POA: Insufficient documentation

## 2014-08-16 DIAGNOSIS — Y9289 Other specified places as the place of occurrence of the external cause: Secondary | ICD-10-CM | POA: Insufficient documentation

## 2014-08-16 DIAGNOSIS — G8929 Other chronic pain: Secondary | ICD-10-CM | POA: Insufficient documentation

## 2014-08-16 DIAGNOSIS — Y93E5 Activity, floor mopping and cleaning: Secondary | ICD-10-CM | POA: Insufficient documentation

## 2014-08-16 DIAGNOSIS — W19XXXA Unspecified fall, initial encounter: Secondary | ICD-10-CM

## 2014-08-16 DIAGNOSIS — S4991XA Unspecified injury of right shoulder and upper arm, initial encounter: Secondary | ICD-10-CM

## 2014-08-16 DIAGNOSIS — W11XXXA Fall on and from ladder, initial encounter: Secondary | ICD-10-CM | POA: Insufficient documentation

## 2014-08-16 DIAGNOSIS — Z8719 Personal history of other diseases of the digestive system: Secondary | ICD-10-CM | POA: Insufficient documentation

## 2014-08-16 DIAGNOSIS — S3992XA Unspecified injury of lower back, initial encounter: Secondary | ICD-10-CM | POA: Insufficient documentation

## 2014-08-16 DIAGNOSIS — Z79899 Other long term (current) drug therapy: Secondary | ICD-10-CM | POA: Insufficient documentation

## 2014-08-16 NOTE — ED Notes (Signed)
Pt was cleaning gutters as he came down from ladder he fell from the 5th step landing on cement.  Pt is c/o lower back pain, right shoulder down to wrist and right foot pain.  Denies hitting head or LOC.

## 2014-08-16 NOTE — ED Notes (Signed)
Patient transported to X-ray 

## 2014-08-17 MED ORDER — CYCLOBENZAPRINE HCL 10 MG PO TABS: 10.0000 mg | ORAL_TABLET | Freq: Two times a day (BID) | ORAL | Status: DC | PRN

## 2014-08-17 MED ORDER — TRAMADOL HCL 50 MG PO TABS: 50.0000 mg | ORAL_TABLET | Freq: Four times a day (QID) | ORAL | Status: DC | PRN

## 2014-08-17 MED ORDER — ACETAMINOPHEN 500 MG PO TABS
1000.0000 mg | ORAL_TABLET | Freq: Once | ORAL | Status: DC
Start: 2014-08-17 — End: 2014-08-17
  Filled 2014-08-17: qty 2

## 2014-08-17 NOTE — ED Provider Notes (Signed)
CSN: [id number]     Arrival date & time 08/16/14  2154 History   First MD Initiated Contact with Patient 08/16/14 2255     Chief Complaint  Patient presents with  . Fall     (Consider location/radiation/quality/duration/timing/severity/associated sxs/prior Treatment) HPI Comments: Patient fell from a ladder prior to arrival. He reports aching pain of the lower back, right shoulder, elbow, and wrist and right foot. Movement and palpation makes the pain worse. No alleviating factors.   Patient is a 44 y.o. male presenting with fall. The history is provided by the patient. No language interpreter was used.  Fall This is a new problem. The current episode started today. The problem occurs constantly. The problem has been unchanged. Associated symptoms include arthralgias and joint swelling. Exacerbated by: movement and palpation. He has tried nothing for the symptoms. The treatment provided no relief.    Past Medical History  Diagnosis Date  . Irregular heartbeat   . Chronic back pain   . Chronic lumbar pain   . GERD (gastroesophageal reflux disease)   . Rectal polyp   . Degenerative disc disease    Past Surgical History  Procedure Laterality Date  . Laceration repair      repair to right arm that severed artery  . Arm surgery    . Groin exploration    . Cholecystectomy     Family History  Problem Relation Age of Onset  . Alzheimer's disease Other    History  Substance Use Topics  . Smoking status: Current Every Day Smoker -- 1.00 packs/day    Types: Cigarettes  . Smokeless tobacco: Never Used  . Alcohol Use: No    Review of Systems  Musculoskeletal: Positive for back pain, joint swelling and arthralgias.  All other systems reviewed and are negative.     Allergies  Codeine; Ibuprofen; Ketorolac tromethamine; Nsaids; and Prednisone  Home Medications   Prior to Admission medications   Medication Sig Start Date End Date Taking? Authorizing Provider  acetaminophen  (TYLENOL) 500 MG tablet Take 1,000 mg by mouth every 6 (six) hours as needed for mild pain or moderate pain.     Historical Provider, MD  albuterol (PROVENTIL HFA;VENTOLIN HFA) 108 (90 BASE) MCG/ACT inhaler Inhale 1-2 puffs into the lungs every 6 (six) hours as needed for wheezing or shortness of breath. 11/03/13   Hope Orlene OchM Neese, NP  benzocaine (ORAJEL) 10 % mucosal gel Use as directed 1 application in the mouth or throat as needed for mouth pain.    Historical Provider, MD  fluticasone (FLOVENT HFA) 110 MCG/ACT inhaler Inhale 1 puff into the lungs 2 (two) times daily. Patient not taking: Reported on 08/16/2014 11/03/13   Janne NapoleonHope M Neese, NP  guaifenesin (ROBITUSSIN) 100 MG/5ML syrup Take 5-10 mLs (100-200 mg total) by mouth every 4 (four) hours as needed for cough or congestion. Patient not taking: Reported on 08/16/2014 11/03/13   Janne NapoleonHope M Neese, NP  HYDROcodone-acetaminophen (NORCO/VICODIN) 5-325 MG per tablet Take 1 tablet by mouth every 6 (six) hours as needed for moderate pain. Patient not taking: Reported on 08/16/2014 10/07/13   Charlestine Nighthristopher Lawyer, PA-C  oxyCODONE-acetaminophen (PERCOCET/ROXICET) 5-325 MG per tablet Take 1 tablet by mouth every 6 (six) hours as needed. Patient not taking: Reported on 08/16/2014 04/01/14   Bethann BerkshireJoseph Zammit, MD  traMADol (ULTRAM) 50 MG tablet Take 1 tablet (50 mg total) by mouth every 6 (six) hours as needed. Patient not taking: Reported on 08/16/2014 09/14/13   Gilda Creasehristopher J Pollina, MD  BP 123/75 mmHg  Pulse 66  Temp(Src) 98.7 F (37.1 C) (Oral)  Resp 18  SpO2 98% Physical Exam  Constitutional: He is oriented to person, place, and time. He appears well-developed and well-nourished. No distress.  HENT:  Head: Normocephalic and atraumatic.  Eyes: Conjunctivae and EOM are normal.  Neck: Normal range of motion.  Cardiovascular: Normal rate and regular rhythm.  Exam reveals no gallop and no friction rub.   No murmur heard. Pulmonary/Chest: Effort normal and breath sounds  normal. He has no wheezes. He has no rales. He exhibits no tenderness.  Abdominal: Soft. He exhibits no distension. There is no tenderness. There is no rebound.  Musculoskeletal: Normal range of motion.  Midline lumbar tenderness to palpation.   Generalized right foot tenderness to palpation with generalized swelling. Slightly limited ROM due to pain. No obvious deformity.   Mild generalized tenderness of right shoulder, elbow, and wrist. No focal tenderness to palpation. Full ROM of the affected joints.   Neurological: He is alert and oriented to person, place, and time. Coordination normal.  Speech is goal-oriented. Moves limbs without ataxia.   Skin: Skin is warm and dry.  Psychiatric: He has a normal mood and affect. His behavior is normal.  Nursing note and vitals reviewed.   ED Course  Procedures (including critical care time) Labs Review Labs Reviewed - No data to display  Imaging Review Dg Lumbar Spine Complete  08/16/2014   CLINICAL DATA:  Larey Seat from a ladder landing on cement. Low back pain, right shoulder pain, wrist pain, and right foot pain.  EXAM: LUMBAR SPINE - COMPLETE 4+ VIEW  COMPARISON:  10/07/2012  FINDINGS: There is no evidence of lumbar spine fracture. Alignment is normal. Intervertebral disc spaces are maintained. Mild endplate hypertrophic degenerative changes in the lower thoracic spine at L5-S1.  IMPRESSION: Mild degenerative changes.  No acute displaced fractures identified.   Electronically Signed   By: Burman Nieves M.D.   On: 08/16/2014 23:34   Dg Shoulder Right  08/16/2014   CLINICAL DATA:  Larey Seat down ladder.  Right shoulder pain  EXAM: RIGHT SHOULDER - 2+ VIEW  COMPARISON:  10/31/2012  FINDINGS: There is no evidence of fracture or dislocation. There is no evidence of arthropathy or other focal bone abnormality. Soft tissues are unremarkable.  IMPRESSION: Negative.   Electronically Signed   By: Burman Nieves M.D.   On: 08/16/2014 23:35   Dg Elbow Complete  Right  08/16/2014   CLINICAL DATA:  Right elbow pain after a fall.  EXAM: RIGHT ELBOW - COMPLETE 3+ VIEW  COMPARISON:  Right forearm 04/30/2013  FINDINGS: There is no evidence of fracture, dislocation, or joint effusion. Mild hypertrophic degenerative changes in the elbow joint. No definite evidence of any effusion although nonstandard positioning on the lateral view limits evaluation for effusion. Surgical clips in the soft tissues. Soft tissues are unremarkable.  IMPRESSION: Degenerative changes in the right elbow. No acute bony abnormalities.   Electronically Signed   By: Burman Nieves M.D.   On: 08/16/2014 23:37   Dg Wrist Complete Right  08/16/2014   CLINICAL DATA:  Right wrist pain after a fall.  EXAM: RIGHT WRIST - COMPLETE 3+ VIEW  COMPARISON:  07/15/2013  FINDINGS: There is no evidence of fracture or dislocation. There is no evidence of arthropathy or other focal bone abnormality. Soft tissues are unremarkable.  IMPRESSION: Negative.   Electronically Signed   By: Burman Nieves M.D.   On: 08/16/2014 23:38   Dg  Foot Complete Right  08/16/2014   CLINICAL DATA:  44 year old male with fall and foot pain.  EXAM: RIGHT FOOT COMPLETE - 3+ VIEW  COMPARISON:  None.  FINDINGS: No evidence of acute fracture. An old healed fracture with callus formation is noted in the proximal phalanx of the fourth digit. The soft tissues are grossly unremarkable. No radiopaque foreign object.  IMPRESSION: No definite acute fracture or dislocation.  Old-appearing fracture of the proximal phalanx of the fourth digit. Clinical correlation is recommended.   Electronically Signed   By: Elgie Collard M.D.   On: 08/16/2014 23:39     EKG Interpretation None      MDM   Final diagnoses:  Fall  Midline low back pain without sciatica  Right foot injury, initial encounter  Arm injury, right, initial encounter    12:01 AM Patient's xrays reviewed by me which show no acute fractures. No head trauma or LOC. Patient  will have tramadol and flexeril for pain. Vitals stable and patient afebrile.    [address] [place], PA-C 08/17/14 0012  Marisa Severin, MD 08/17/14 [phone]

## 2014-08-17 NOTE — Discharge Instructions (Signed)
Take Tramadol as needed for pain. Take Flexeril as needed for muscle spasm. You may take these medications together. Refer to attached documents for more information.  °

## 2014-08-17 NOTE — ED Notes (Signed)
In to giev tylenol and go over d/c instructions. Pt refused tylenol "I took two  at home". Offered to ask about alternative medication. Pt refused. Ambulatory upon d/c with steady gait in NAD.

## 2014-09-08 ENCOUNTER — Encounter (HOSPITAL_COMMUNITY): Payer: Self-pay | Admitting: *Deleted

## 2014-09-08 ENCOUNTER — Emergency Department (HOSPITAL_COMMUNITY)
Admission: EM | Admit: 2014-09-08 | Discharge: 2014-09-08 | Disposition: A | Discharge status: Home / Self Care | Payer: Self-pay | Attending: Emergency Medicine | Admitting: Emergency Medicine

## 2014-09-08 DIAGNOSIS — Z8679 Personal history of other diseases of the circulatory system: Secondary | ICD-10-CM | POA: Insufficient documentation

## 2014-09-08 DIAGNOSIS — G8929 Other chronic pain: Secondary | ICD-10-CM | POA: Insufficient documentation

## 2014-09-08 DIAGNOSIS — Z720 Tobacco use: Secondary | ICD-10-CM | POA: Insufficient documentation

## 2014-09-08 DIAGNOSIS — H18829 Corneal disorder due to contact lens, unspecified eye: Secondary | ICD-10-CM

## 2014-09-08 DIAGNOSIS — H18823 Corneal disorder due to contact lens, bilateral: Secondary | ICD-10-CM | POA: Insufficient documentation

## 2014-09-08 DIAGNOSIS — Z8719 Personal history of other diseases of the digestive system: Secondary | ICD-10-CM | POA: Insufficient documentation

## 2014-09-08 MED ORDER — CIPROFLOXACIN HCL 0.3 % OP SOLN: 2.0000 [drp] | OPHTHALMIC | Status: DC

## 2014-09-08 MED ORDER — FLUORESCEIN SODIUM 1 MG OP STRP
1.0000 | ORAL_STRIP | Freq: Once | OPHTHALMIC | Status: DC
  Filled 2014-09-08: qty 1

## 2014-09-08 MED ORDER — TETRACAINE HCL 0.5 % OP SOLN
2.0000 [drp] | Freq: Once | OPHTHALMIC | Status: DC
Start: 2014-09-08 — End: 2014-09-08
  Filled 2014-09-08: qty 2

## 2014-09-08 NOTE — Discharge Instructions (Signed)
Corneal Abrasion °The cornea is the clear covering at the front and center of the eye. When looking at the colored portion of the eye (iris), you are looking through the cornea. This very thin tissue is made up of many layers. The surface layer is a single layer of cells (corneal epithelium) and is one of the most sensitive tissues in the body. If a scratch or injury causes the corneal epithelium to come off, it is called a corneal abrasion. If the injury extends to the tissues below the epithelium, the condition is called a corneal ulcer. °CAUSES  °· Scratches. °· Trauma. °· Foreign body in the eye. °Some people have recurrences of abrasions in the area of the original injury even after it has healed (recurrent erosion syndrome). Recurrent erosion syndrome generally improves and goes away with time. °SYMPTOMS  °· Eye pain. °· Difficulty or inability to keep the injured eye open. °· The eye becomes very sensitive to light. °· Recurrent erosions tend to happen suddenly, first thing in the morning, usually after waking up and opening the eye. °DIAGNOSIS  °Your health care provider can diagnose a corneal abrasion during an eye exam. Dye is usually placed in the eye using a drop or a small paper strip moistened by your tears. When the eye is examined with a special light, the abrasion shows up clearly because of the dye. °TREATMENT  °· Small abrasions may be treated with antibiotic drops or ointment alone. °· A pressure patch may be put over the eye. If this is done, follow your doctor's instructions for when to remove the patch. Do not drive or use machines while the eye patch is on. Judging distances is hard to do with a patch on. °If the abrasion becomes infected and spreads to the deeper tissues of the cornea, a corneal ulcer can result. This is serious because it can cause corneal scarring. Corneal scars interfere with light passing through the cornea and cause a loss of vision in the involved eye. °HOME CARE  INSTRUCTIONS °· Use medicine or ointment as directed. Only take over-the-counter or prescription medicines for pain, discomfort, or fever as directed by your health care provider. °· Do not drive or operate machinery if your eye is patched. Your ability to judge distances is impaired. °· If your health care provider has given you a follow-up appointment, it is very important to keep that appointment. Not keeping the appointment could result in a severe eye infection or permanent loss of vision. If there is any problem keeping the appointment, let your health care provider know. °SEEK MEDICAL CARE IF:  °· You have pain, light sensitivity, and a scratchy feeling in one eye or both eyes. °· Your pressure patch keeps loosening up, and you can blink your eye under the patch after treatment. °· Any kind of discharge develops from the eye after treatment or if the lids stick together in the morning. °· You have the same symptoms in the morning as you did with the original abrasion days, weeks, or months after the abrasion healed. °MAKE SURE YOU:  °· Understand these instructions. °· Will watch your condition. °· Will get help right away if you are not doing well or get worse. °Document Released: 01/16/2000 Document Revised: 01/23/2013 Document Reviewed: 09/25/2012 °ExitCare® Patient Information ©2015 [employer]. This information is not intended to replace advice given to you by your health care provider. Make sure you discuss any questions you have with your health care provider. ° °

## 2014-09-08 NOTE — ED Notes (Signed)
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

## 2014-09-08 NOTE — ED Provider Notes (Signed)
CSN: [id number]     Arrival date & time 09/08/14  0234 History  This chart was scribed for Miosha Behe, MD by Doreatha Martin, ED Scribe. This patient was seen in room WA06/WA06 and the patient's care was started at 3:16 AM.     Chief Complaint  Patient presents with  . Eye Pain   Patient is a 44 y.o. male presenting with eye pain. The history is provided by the patient. No language interpreter was used.  Eye Pain This is a new problem. The current episode started 6 to 12 hours ago. The problem occurs constantly. The problem has been gradually worsening. Pertinent negatives include no chest pain, no abdominal pain, no headaches and no shortness of breath. Nothing aggravates the symptoms. Nothing relieves the symptoms. He has tried nothing for the symptoms. The treatment provided no relief.    HPI Comments: John Clark is a 44 y.o. male who presents to the Emergency Department complaining of moderate, gradual onset, burning bilateral eye pain onset 9 hours ago. Pt notes associated redness, photophobia, blurry vision. 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 Pt reports that he awoke from sleep with the eye pain. Tetanus is UTD.   Past Medical History  Diagnosis Date  . Irregular heartbeat   . Chronic back pain   . Chronic lumbar pain   . GERD (gastroesophageal reflux disease)   . Rectal polyp   . Degenerative disc disease    Past Surgical History  Procedure Laterality Date  . Laceration repair      repair to right arm that severed artery  . Arm surgery    . Groin exploration    . Cholecystectomy     Family History  Problem Relation Age of Onset  . Alzheimer's disease Other    History  Substance Use Topics  . Smoking status: Current Every Day Smoker -- 1.00 packs/day    Types: Cigarettes  . Smokeless tobacco: Never Used  . Alcohol Use: No    Review of Systems  Eyes: Positive for photophobia, pain and redness.  Respiratory: Negative  for shortness of breath.   Cardiovascular: Negative for chest pain.  Gastrointestinal: Negative for abdominal pain.  Neurological: Negative for headaches.  All other systems reviewed and are negative.  Allergies  Codeine; Ibuprofen; Ketorolac tromethamine; Nsaids; and Prednisone  Home Medications   Prior to Admission medications   Medication Sig Start Date End Date Taking? Authorizing Provider  acetaminophen (TYLENOL) 500 MG tablet Take 1,000 mg by mouth every 6 (six) hours as needed for mild pain or moderate pain.     Historical Provider, MD  albuterol (PROVENTIL HFA;VENTOLIN HFA) 108 (90 BASE) MCG/ACT inhaler Inhale 1-2 puffs into the lungs every 6 (six) hours as needed for wheezing or shortness of breath. Patient not taking: Reported on 09/08/2014 11/03/13   Janne Napoleon, NP  benzocaine (ORAJEL) 10 % mucosal gel Use as directed 1 application in the mouth or throat as needed for mouth pain.    Historical Provider, MD  cyclobenzaprine (FLEXERIL) 10 MG tablet Take 1 tablet (10 mg total) by mouth 2 (two) times daily as needed for muscle spasms. Patient not taking: Reported on 09/08/2014 08/17/14   Emilia Beck, PA-C  fluticasone (FLOVENT HFA) 110 MCG/ACT inhaler Inhale 1 puff into the lungs 2 (two) times daily. Patient not taking: Reported on 08/16/2014 11/03/13   Janne Napoleon, NP  guaifenesin (ROBITUSSIN) 100 MG/5ML syrup Take 5-10 mLs (  100-200 mg total) by mouth every 4 (four) hours as needed for cough or congestion. Patient not taking: Reported on 08/16/2014 11/03/13   Janne Napoleon, NP  HYDROcodone-acetaminophen (NORCO/VICODIN) 5-325 MG per tablet Take 1 tablet by mouth every 6 (six) hours as needed for moderate pain. Patient not taking: Reported on 08/16/2014 10/07/13   Charlestine Night, PA-C  oxyCODONE-acetaminophen (PERCOCET/ROXICET) 5-325 MG per tablet Take 1 tablet by mouth every 6 (six) hours as needed. Patient not taking: Reported on 08/16/2014 04/01/14   Bethann Berkshire, MD  traMADol  (ULTRAM) 50 MG tablet Take 1 tablet (50 mg total) by mouth every 6 (six) hours as needed. Patient not taking: Reported on 09/08/2014 08/17/14   Kaitlyn Szekalski, PA-C   BP 138/88 mmHg  Pulse 81  Temp(Src) 98.3 F (36.8 C) (Oral)  Resp 20  Ht  (1.778 m)  Wt 230 lb (104.327 kg)  BMI 33.00 kg/m2  SpO2 97% Physical Exam  Constitutional: He is oriented to person, place, and time. He appears well-developed and well-nourished.  HENT:  Head: Normocephalic and atraumatic.  Mouth/Throat: Oropharynx is clear and moist. No oropharyngeal exudate.  Eyes: EOM are normal. Pupils are equal, round, and reactive to light. Right eye exhibits no chemosis and no discharge. Left eye exhibits no chemosis and no discharge. Right conjunctiva is injected. Right conjunctiva has no hemorrhage. Left conjunctiva is injected. Left conjunctiva has no hemorrhage. Right eye exhibits normal extraocular motion. Left eye exhibits normal extraocular motion.  Slit lamp exam:      The right eye shows corneal abrasion. The right eye shows no corneal flare, no corneal ulcer and no foreign body.       The left eye shows corneal abrasion. The left eye shows no corneal flare, no corneal ulcer and no foreign body.  Corneal abrasion to the 6 o clock position of the right eye. No stippling. Corneal abrasion to the left eye in the 7 and 8 o'clock positions. No signs of ulceration. No stippling.   Neck: Normal range of motion. Neck supple. No tracheal deviation present.  Trachea midline.   Cardiovascular: Normal rate, regular rhythm and normal heart sounds.   Pulmonary/Chest: Effort normal and breath sounds normal. No respiratory distress.  Lungs CTA  Abdominal: Soft. Bowel sounds are normal. He exhibits no distension. There is no tenderness. There is no rebound.  Musculoskeletal: Normal range of motion.  Neurological: He is alert and oriented to person, place, and time.  Skin: Skin is warm and dry.  Psychiatric: He has a normal  mood and affect. His behavior is normal.  Nursing note and vitals reviewed.  ED Course  Procedures (including critical care time) DIAGNOSTIC STUDIES: Oxygen Saturation is 97% on RA, normal by my interpretation.    COORDINATION OF CARE: 3:21 AM Discussed treatment plan with pt at bedside and pt agreed to plan.   Labs Review Labs Reviewed - No data to display  Imaging Review No results found.   EKG Interpretation None      MDM   Final diagnoses:  None  tetanus utd    Visual Acuity  Right Eye Distance: 2060 Left Eye Distance: 0/0 Bilateral Distance: 20/100  Right Eye Near:   Left Eye Near:    Bilateral Near:     Ciloxan, tylenol and follow up with Dr. Charlotte Sanes Monday.  Call to be seen.  Patient verbalizes understanding and agrees to follow up I personally performed the services described in this documentation, which was scribed in my presence.  The recorded information has been reviewed and is accurate.    Cy Blamer, MD 09/08/14 [phone]

## 2014-11-23 ENCOUNTER — Encounter (HOSPITAL_COMMUNITY): Payer: Self-pay | Admitting: *Deleted

## 2014-11-23 ENCOUNTER — Emergency Department (HOSPITAL_COMMUNITY)
Admission: EM | Admit: 2014-11-23 | Discharge: 2014-11-23 | Disposition: A | Discharge status: Home / Self Care | Payer: Self-pay | Attending: Emergency Medicine | Admitting: Emergency Medicine

## 2014-11-23 DIAGNOSIS — G8929 Other chronic pain: Secondary | ICD-10-CM | POA: Insufficient documentation

## 2014-11-23 DIAGNOSIS — T5794XA Toxic effect of unspecified inorganic substance, undetermined, initial encounter: Secondary | ICD-10-CM | POA: Insufficient documentation

## 2014-11-23 DIAGNOSIS — Z8739 Personal history of other diseases of the musculoskeletal system and connective tissue: Secondary | ICD-10-CM | POA: Insufficient documentation

## 2014-11-23 DIAGNOSIS — J3489 Other specified disorders of nose and nasal sinuses: Secondary | ICD-10-CM | POA: Insufficient documentation

## 2014-11-23 DIAGNOSIS — Z8719 Personal history of other diseases of the digestive system: Secondary | ICD-10-CM | POA: Insufficient documentation

## 2014-11-23 DIAGNOSIS — Y9389 Activity, other specified: Secondary | ICD-10-CM | POA: Insufficient documentation

## 2014-11-23 DIAGNOSIS — Z8679 Personal history of other diseases of the circulatory system: Secondary | ICD-10-CM | POA: Insufficient documentation

## 2014-11-23 DIAGNOSIS — Y990 Civilian activity done for income or pay: Secondary | ICD-10-CM | POA: Insufficient documentation

## 2014-11-23 DIAGNOSIS — Y9289 Other specified places as the place of occurrence of the external cause: Secondary | ICD-10-CM | POA: Insufficient documentation

## 2014-11-23 DIAGNOSIS — H10212 Acute toxic conjunctivitis, left eye: Secondary | ICD-10-CM | POA: Insufficient documentation

## 2014-11-23 DIAGNOSIS — X58XXXA Exposure to other specified factors, initial encounter: Secondary | ICD-10-CM | POA: Insufficient documentation

## 2014-11-23 DIAGNOSIS — Z720 Tobacco use: Secondary | ICD-10-CM | POA: Insufficient documentation

## 2014-11-23 DIAGNOSIS — Z79899 Other long term (current) drug therapy: Secondary | ICD-10-CM | POA: Insufficient documentation

## 2014-11-23 MED ORDER — FLUORESCEIN SODIUM 1 MG OP STRP
1.0000 | ORAL_STRIP | Freq: Once | OPHTHALMIC | Status: AC
  Administered 2014-11-23: 1 via OPHTHALMIC
  Filled 2014-11-23: qty 1

## 2014-11-23 MED ORDER — PREDNISOLONE ACETATE 1 % OP SUSP
2.0000 [drp] | Freq: Once | OPHTHALMIC | Status: AC
  Administered 2014-11-23: 2 [drp] via OPHTHALMIC
  Filled 2014-11-23: qty 1

## 2014-11-23 MED ORDER — TETRACAINE HCL 0.5 % OP SOLN
1.0000 [drp] | Freq: Once | OPHTHALMIC | Status: AC
  Administered 2014-11-23: 1 [drp] via OPHTHALMIC
  Filled 2014-11-23: qty 2

## 2014-11-23 MED ORDER — FLUORESCEIN SODIUM 1 MG OP STRP
ORAL_STRIP | OPHTHALMIC | Status: AC
  Administered 2014-11-23: 20:00:00 via OPHTHALMIC
  Filled 2014-11-23: qty 1

## 2014-11-23 MED ORDER — PREDNISOLONE ACETATE 1 % OP SUSP
OPHTHALMIC | Status: AC
  Filled 2014-11-23: qty 5

## 2014-11-23 NOTE — ED Notes (Addendum)
Pt states understanding of care given and follow up instructions with Dr. Lita MainsHaines

## 2014-11-23 NOTE — ED Notes (Signed)
States he dropped some glue in his left eye yesterday, states his vision is getting worse in the affected eye

## 2014-11-23 NOTE — Discharge Instructions (Signed)
Chemical Conjunctivitis Chemical conjunctivitis is eye inflammation from exposure to an irritant or chemical substance. This causes the clear membrane that covers the white part of your eye and the inner surface of your eyelid (conjunctiva) to become inflamed. When the blood vessels in the conjunctiva become inflamed, the eye may become red, pink, and itchy. Chemical conjunctivitis can occur in one or both eyes. It cannot be spread by one person to another person (noncontagious). CAUSES This condition is caused by exposure to a chemical substance or irritant, such as:  Smoke.  Chlorine.  Soap.  Fumes.  Air pollution. RISK FACTORS This condition is more likely to develop in:  People who live somewhere with high levels of air pollution.  People who use swimming pools often. SYMPTOMS Symptoms of this condition may include:  Eye redness.  Tearing of the eyes.  Watery eyes.  Itchy eyes.  Burning feeling in the eyes.  Clear drainage from the eyes.  Swollen eyelids.  Sensitivity to light. DIAGNOSIS Your health care provider can diagnose this condition from your symptoms and medical history. The health care provider will also do a physical exam. If you have drainage from your eyes, it may be tested to rule out other causes of conjunctivitis. Your health care provider may also use a medical instrument that uses magnified light to examine the eyes (slit lamp).  TREATMENT Treatment for this condition involves carefully flushing the chemical out of your eye. You may also get antiallergy medicines or eye drops to use at home. HOME CARE INSTRUCTIONS  Take or apply medicines only as directed by your health care provider.  Do not touch or rub your eyes.  Do not wear contact lenses until the inflammation is gone. Wear glasses instead.  Do not wear eye makeup until the inflammation is gone.  Apply a cool, clean washcloth to your eye for 10-20 minutes, 3-4 times a day.  Avoid  exposure to the chemical or environment that caused the irritation. Wear eye protection as necessary. SEEK MEDICAL CARE IF:  Your symptoms get worse.  You have pus draining from your eye.  You have new symptoms.  You have a fever.  You have a change in vision.  You have increasing pain.   This information is not intended to replace advice given to you by your health care provider. Make sure you discuss any questions you have with your health care provider.   Document Released: 10/28/2004 Document Revised: 02/08/2014 Document Reviewed: 10/30/2013 Elsevier Interactive Patient Education 2016 [employer]   Apply one drop of the Pred Forte to your left eye 4 times daily.  Wear sunglasses if it helps with light sensitivity.  Avoid rubbing your eye.  Call Dr. Lita MainsHaines for a recheck of your eye on Monday.  In the interim, return here if you develop any worsened pain or new symptoms.

## 2014-11-24 NOTE — ED Provider Notes (Signed)
CSN: [id number]     Arrival date & time 11/23/14  1842 History   First MD Initiated Contact with Patient 11/23/14 1849     Chief Complaint  Patient presents with  . Foreign Body in Eye     (Consider location/radiation/quality/duration/timing/severity/associated sxs/prior Treatment) The history is provided by the patient.   John Clark is a 44 y.o. male presenting with complaint of worsening left eye pain, redness and eyelid swelling along with decreasing visual acuity and photophobia since plumbers glue dripped in his eye yesterday morning while working.  He read the msds sheet for the product (did not bring with him) which said to wash with water, which he states he did about 10 times, copiously.  He felt some better yesterday evening but overnight his pain and visual acuity has worsened.  He describes intermittent blurred vision which does improve some when clearing the eye of the copious tears and thick drainage which has developed.  He endorses pain around the eye.  He has taken no medicines or found any relief from this pain.    Past Medical History  Diagnosis Date  . Irregular heartbeat   . Chronic back pain   . Chronic lumbar pain   . GERD (gastroesophageal reflux disease)   . Rectal polyp   . Degenerative disc disease    Past Surgical History  Procedure Laterality Date  . Laceration repair      repair to right arm that severed artery  . Arm surgery    . Groin exploration    . Cholecystectomy     Family History  Problem Relation Age of Onset  . Alzheimer's disease Other    Social History  Substance Use Topics  . Smoking status: Current Every Day Smoker -- 1.00 packs/day    Types: Cigarettes  . Smokeless tobacco: Never Used  . Alcohol Use: No    Review of Systems  Constitutional: Negative for fever and chills.  HENT: Positive for rhinorrhea. Negative for sore throat.   Eyes: Positive for photophobia, pain, discharge, redness and visual disturbance.   Respiratory: Negative.   Cardiovascular: Negative.   Gastrointestinal: Negative for nausea and vomiting.  Genitourinary: Negative.   Musculoskeletal: Negative for arthralgias.  Skin: Negative.  Negative for rash and wound.  Neurological: Negative for dizziness, weakness, light-headedness, numbness and headaches.  Psychiatric/Behavioral: Negative.       Allergies  Codeine; Ibuprofen; Ketorolac tromethamine; Nsaids; and Prednisone  Home Medications   Prior to Admission medications   Medication Sig Start Date End Date Taking? Authorizing Provider  acetaminophen (TYLENOL) 500 MG tablet Take 1,000 mg by mouth every 6 (six) hours as needed for mild pain or moderate pain.     Historical Provider, MD  albuterol (PROVENTIL HFA;VENTOLIN HFA) 108 (90 BASE) MCG/ACT inhaler Inhale 1-2 puffs into the lungs every 6 (six) hours as needed for wheezing or shortness of breath. Patient not taking: Reported on 09/08/2014 11/03/13   Janne Napoleon, NP  benzocaine (ORAJEL) 10 % mucosal gel Use as directed 1 application in the mouth or throat as needed for mouth pain.    Historical Provider, MD  ciprofloxacin (CILOXAN) 0.3 % ophthalmic solution Place 2 drops into both eyes every 4 (four) hours while awake. Administer 1 drop, every 2 hours, while awake, for 2 days. Then 1 drop, every 4 hours, while awake, for the next 5 days. 09/08/14   April Palumbo, MD  cyclobenzaprine (FLEXERIL) 10 MG tablet Take 1 tablet (10 mg total) by mouth 2 (  two) times daily as needed for muscle spasms. Patient not taking: Reported on 09/08/2014 08/17/14   Emilia BeckKaitlyn Szekalski, PA-C  fluticasone (FLOVENT HFA) 110 MCG/ACT inhaler Inhale 1 puff into the lungs 2 (two) times daily. Patient not taking: Reported on 08/16/2014 11/03/13   Janne NapoleonHope M Neese, NP  guaifenesin (ROBITUSSIN) 100 MG/5ML syrup Take 5-10 mLs (100-200 mg total) by mouth every 4 (four) hours as needed for cough or congestion. Patient not taking: Reported on 08/16/2014 11/03/13   Janne NapoleonHope M  Neese, NP  HYDROcodone-acetaminophen (NORCO/VICODIN) 5-325 MG per tablet Take 1 tablet by mouth every 6 (six) hours as needed for moderate pain. Patient not taking: Reported on 08/16/2014 10/07/13   Charlestine Nighthristopher Lawyer, PA-C  oxyCODONE-acetaminophen (PERCOCET/ROXICET) 5-325 MG per tablet Take 1 tablet by mouth every 6 (six) hours as needed. Patient not taking: Reported on 08/16/2014 04/01/14   Bethann BerkshireJoseph Zammit, MD  traMADol (ULTRAM) 50 MG tablet Take 1 tablet (50 mg total) by mouth every 6 (six) hours as needed. Patient not taking: Reported on 09/08/2014 08/17/14   Kaitlyn Szekalski, PA-C   BP 128/74 mmHg  Pulse 75  Temp(Src) 98.4 F (36.9 C) (Oral)  Resp 18  Ht 5\' 9"  (1.753 m)  Wt 230 lb (104.327 kg)  BMI 33.95 kg/m2  SpO2 97% Physical Exam  Constitutional: He is oriented to person, place, and time. He appears well-developed and well-nourished.  HENT:  Head: Normocephalic and atraumatic.  Right Ear: Ear canal normal.  Left Ear: Ear canal normal.  Nose: Rhinorrhea present.  Mouth/Throat: Uvula is midline, oropharynx is clear and moist and mucous membranes are normal. No oropharyngeal exudate, posterior oropharyngeal edema, posterior oropharyngeal erythema or tonsillar abscesses.  Eyes: EOM are normal. Pupils are equal, round, and reactive to light. Left eye exhibits discharge and exudate. Left eye exhibits no chemosis. No foreign body present in the left eye. Left conjunctiva is injected. Left conjunctiva has no hemorrhage.  Fundoscopic exam:      The left eye shows red reflex.  Slit lamp exam:      The left eye shows no corneal abrasion, no corneal flare, no corneal ulcer, no foreign body, no fluorescein uptake and no anterior chamber bulge.  Thick white ropy discharge.  No consensual pain in affected eye.  Visual Acuity - Bilateral Distance: 20/10 ; R Distance: 20/10 ; L Distance: 20/30  Eye pressure measured 3x- 1,2 and 3 mm/hg resp.  Eye pH measured, green closer to 7 than 8 on pH scale.   Repeated by Dr. Manus Gunningancour who thought it closer to 8.  Cardiovascular: Normal rate and normal heart sounds.   Pulmonary/Chest: Effort normal. No respiratory distress. He has no wheezes. He has no rales.  Abdominal: Soft. There is no tenderness.  Musculoskeletal: Normal range of motion.  Neurological: He is alert and oriented to person, place, and time.  Skin: Skin is warm and dry. No rash noted.  Psychiatric: He has a normal mood and affect.    ED Course  Procedures (including critical care time) Labs Review Labs Reviewed - No data to display  Imaging Review No results found. I have personally reviewed and evaluated these images and lab results as part of my medical decision-making.   EKG Interpretation None      MDM   Final diagnoses:  Chemical conjunctivitis of left eye    Tetracaine was applied to the eye prior to a 400 cc NS flush using morgan lens.  PH rechecked and unchanged.  Pt reports no significant change in  pain level but did appear more comfortable, holding the eye more fully open, and did not appear to be as photophobic as on first arrival.  Pt was seen by Dr Manus Gunning.  Also discussed with Dr. Lita Mains who recommended pred forte since patient allergic to nsaids, specifically ketorolac. This was given pt.  He will see pt in his office Monday afternoon for a recheck, pt to call for appt.  He was also advised to return here sooner for any worsened sx.  First dose of pred forte instilled prior to dc home.    Burgess Amor, PA-C 11/24/14 1208  Glynn Octave, MD 11/25/14 [phone]

## 2015-03-22 ENCOUNTER — Emergency Department (HOSPITAL_COMMUNITY)
Admission: EM | Admit: 2015-03-22 | Discharge: 2015-03-22 | Disposition: A | Discharge status: Home / Self Care | Payer: No Typology Code available for payment source | Attending: Emergency Medicine | Admitting: Emergency Medicine

## 2015-03-22 ENCOUNTER — Encounter (HOSPITAL_COMMUNITY): Payer: Self-pay | Admitting: Emergency Medicine

## 2015-03-22 DIAGNOSIS — K047 Periapical abscess without sinus: Secondary | ICD-10-CM | POA: Insufficient documentation

## 2015-03-22 DIAGNOSIS — F17210 Nicotine dependence, cigarettes, uncomplicated: Secondary | ICD-10-CM | POA: Insufficient documentation

## 2015-03-22 DIAGNOSIS — Z8739 Personal history of other diseases of the musculoskeletal system and connective tissue: Secondary | ICD-10-CM | POA: Insufficient documentation

## 2015-03-22 DIAGNOSIS — K029 Dental caries, unspecified: Secondary | ICD-10-CM | POA: Insufficient documentation

## 2015-03-22 DIAGNOSIS — R110 Nausea: Secondary | ICD-10-CM | POA: Insufficient documentation

## 2015-03-22 DIAGNOSIS — K0889 Other specified disorders of teeth and supporting structures: Secondary | ICD-10-CM

## 2015-03-22 DIAGNOSIS — R519 Headache, unspecified: Secondary | ICD-10-CM

## 2015-03-22 DIAGNOSIS — R51 Headache: Secondary | ICD-10-CM | POA: Insufficient documentation

## 2015-03-22 DIAGNOSIS — G8929 Other chronic pain: Secondary | ICD-10-CM | POA: Insufficient documentation

## 2015-03-22 DIAGNOSIS — Z79899 Other long term (current) drug therapy: Secondary | ICD-10-CM | POA: Insufficient documentation

## 2015-03-22 MED ORDER — ONDANSETRON 8 MG PO TBDP: 8.0000 mg | ORAL_TABLET | Freq: Three times a day (TID) | ORAL | Status: DC | PRN

## 2015-03-22 MED ORDER — AMOXICILLIN 250 MG PO CAPS
500.0000 mg | ORAL_CAPSULE | Freq: Once | ORAL | Status: AC
  Administered 2015-03-22: 500 mg via ORAL
  Filled 2015-03-22: qty 2

## 2015-03-22 MED ORDER — HYDROCODONE-ACETAMINOPHEN 5-325 MG PO TABS
2.0000 | ORAL_TABLET | Freq: Once | ORAL | Status: AC
  Administered 2015-03-22: 2 via ORAL
  Filled 2015-03-22: qty 2

## 2015-03-22 MED ORDER — ONDANSETRON 8 MG PO TBDP
8.0000 mg | ORAL_TABLET | Freq: Once | ORAL | Status: AC
  Administered 2015-03-22: 8 mg via ORAL
  Filled 2015-03-22: qty 1

## 2015-03-22 MED ORDER — HYDROCODONE-ACETAMINOPHEN 5-325 MG PO TABS: 1.0000 | ORAL_TABLET | Freq: Four times a day (QID) | ORAL | Status: DC | PRN

## 2015-03-22 MED ORDER — AMOXICILLIN 500 MG PO CAPS: 500.0000 mg | ORAL_CAPSULE | Freq: Three times a day (TID) | ORAL | Status: DC

## 2015-03-22 NOTE — ED Notes (Signed)
Patient c/o left lower dental pain x1 week ago. Per patient swelling and tightness in gums and jaw. Patient now reports "migraine headache with blurred vision in left eye." Per patient now has nausea and vomiting. Per patient using orajel with no relief.

## 2015-03-22 NOTE — Discharge Instructions (Signed)
It was our pleasure to provide your ER care today - we hope that you feel better.  Take antibiotic (amoxicillin) as prescribed.   You may take vicodin as need for pain. No driving for the next 6 hours or when taking vicodin. Also, do not take tylenol or acetaminophen containing medication when taking vicodin.   Follow up with dentist in the next few days  - call office Monday morning to arrange follow up appointment.    Return to ER if worse, facial/neck swelling, high fevers, intractable pain, trouble breathing or swallowing, other concern.  Your blood pressure is elevated today - follow up with primary care doctor in the next 1-2 weeks.    You were given pain medication in the ER - no driving for the next 4 hours.     Dental Caries Dental caries (also called tooth decay) is the most common oral disease. It can occur at any age but is more common in children and young adults.  HOW DENTAL CARIES DEVELOPS  The process of decay begins when bacteria and foods (particularly sugars and starches) combine in your mouth to produce plaque. Plaque is a substance that sticks to the hard, outer surface of a tooth (enamel). The bacteria in plaque produce acids that attack enamel. These acids may also attack the root surface of a tooth (cementum) if it is exposed. Repeated attacks dissolve these surfaces and create holes in the tooth (cavities). If left untreated, the acids destroy the other layers of the tooth.  RISK FACTORS  Frequent sipping of sugary beverages.   Frequent snacking on sugary and starchy foods, especially those that easily get stuck in the teeth.   Poor oral hygiene.   Dry mouth.   Substance abuse such as methamphetamine abuse.   Broken or poor-fitting dental restorations.   Eating disorders.   Gastroesophageal reflux disease (GERD).   Certain radiation treatments to the head and neck. SYMPTOMS In the early stages of dental caries, symptoms are seldom present.  Sometimes white, chalky areas may be seen on the enamel or other tooth layers. In later stages, symptoms may include:  Pits and holes on the enamel.  Toothache after sweet, hot, or cold foods or drinks are consumed.  Pain around the tooth.  Swelling around the tooth. DIAGNOSIS  Most of the time, dental caries is detected during a regular dental checkup. A diagnosis is made after a thorough medical and dental history is taken and the surfaces of your teeth are checked for signs of dental caries. Sometimes special instruments, such as lasers, are used to check for dental caries. Dental X-ray exams may be taken so that areas not visible to the eye (such as between the contact areas of the teeth) can be checked for cavities.  TREATMENT  If dental caries is in its early stages, it may be reversed with a fluoride treatment or an application of a remineralizing agent at the dental office. Thorough brushing and flossing at home is needed to aid these treatments. If it is in its later stages, treatment depends on the location and extent of tooth destruction:   If a small area of the tooth has been destroyed, the destroyed area will be removed and cavities will be filled with a material such as gold, silver amalgam, or composite resin.   If a large area of the tooth has been destroyed, the destroyed area will be removed and a cap (crown) will be fitted over the remaining tooth structure.   If  the center part of the tooth (pulp) is affected, a procedure called a root canal will be needed before a filling or crown can be placed.   If most of the tooth has been destroyed, the tooth may need to be pulled (extracted). HOME CARE INSTRUCTIONS You can prevent, stop, or reverse dental caries at home by practicing good oral hygiene. Good oral hygiene includes:  Thoroughly cleaning your teeth at least twice a day with a toothbrush and dental floss.   Using a fluoride toothpaste. A fluoride mouth rinse may  also be used if recommended by your dentist or health care provider.   Restricting the amount of sugary and starchy foods and sugary liquids you consume.   Avoiding frequent snacking on these foods and sipping of these liquids.   Keeping regular visits with a dentist for checkups and cleanings. PREVENTION   Practice good oral hygiene.  Consider a dental sealant. A dental sealant is a coating material that is applied by your dentist to the pits and grooves of teeth. The sealant prevents food from being trapped in them. It may protect the teeth for several years.  Ask about fluoride supplements if you live in a community without fluorinated water or with water that has a low fluoride content. Use fluoride supplements as directed by your dentist or health care provider.  Allow fluoride varnish applications to teeth if directed by your dentist or health care provider.   This information is not intended to replace advice given to you by your health care provider. Make sure you discuss any questions you have with your health care provider.   Document Released: 10/10/2001 Document Revised: 02/08/2014 Document Reviewed: 01/21/2012 Elsevier Interactive Patient Education 2016 Elsevier Inc.  Dental Abscess A dental abscess is a collection of pus in or around a tooth. CAUSES This condition is caused by a bacterial infection around the root of the tooth that involves the inner part of the tooth (pulp). It may result from:  Severe tooth decay.  Trauma to the tooth that allows bacteria to enter into the pulp, such as a broken or chipped tooth.  Severe gum disease around a tooth. SYMPTOMS Symptoms of this condition include:  Severe pain in and around the infected tooth.  Swelling and redness around the infected tooth, in the mouth, or in the face.  Tenderness.  Pus drainage.  Bad breath.  Bitter taste in the mouth.  Difficulty swallowing.  Difficulty opening the  mouth.  Nausea.  Vomiting.  Chills.  Swollen neck glands.  Fever. DIAGNOSIS This condition is diagnosed with examination of the infected tooth. During the exam, your dentist may tap on the infected tooth. Your dentist will also ask about your medical and dental history and may order X-rays. TREATMENT This condition is treated by eliminating the infection. This may be done with:  Antibiotic medicine.  A root canal. This may be performed to save the tooth.  Pulling (extracting) the tooth. This may also involve draining the abscess. This is done if the tooth cannot be saved. HOME CARE INSTRUCTIONS  Take medicines only as directed by your dentist.  If you were prescribed antibiotic medicine, finish all of it even if you start to feel better.  Rinse your mouth (gargle) often with salt water to relieve pain or swelling.  Do not drive or operate heavy machinery while taking pain medicine.  Do not apply heat to the outside of your mouth.  Keep all follow-up visits as directed by your dentist. This  is important. SEEK MEDICAL CARE IF:  Your pain is worse and is not helped by medicine. SEEK IMMEDIATE MEDICAL CARE IF:  You have a fever or chills.  Your symptoms suddenly get worse.  You have a very bad headache.  You have problems breathing or swallowing.  You have trouble opening your mouth.  You have swelling in your neck or around your eye.   This information is not intended to replace advice given to you by your health care provider. Make sure you discuss any questions you have with your health care provider.   Document Released: 01/18/2005 Document Revised: 06/04/2014 Document Reviewed: 01/15/2014 Elsevier Interactive Patient Education 2016 [employer].    [employer] [facility] [employer] is a great source of information about community services available.  Access by dialing 2-1-1 from anywhere in [place], or by website -   PooledIncome.pl.   Other Local Resources (Updated 02/2015)  Dental  Care   Services    Phone Number and Address  Cost  [facility] For children 26 - 90 years of age:   Cleaning  Tooth brushing/flossing instruction  Sealants, fillings, crowns  Extractions  Emergency treatment  [phone] [address] [place], [place] [address] Charges based on family income.  Medicaid and some insurance plans accepted.     [facility] - [facility], fillings, crowns  Extractions  Emergency treatment [phone] [place], [place]  Pregnant women 71 years of age or older with a Medicaid card  [facility] - [place]  Cleaning  Sealants, fillings, crowns  Extractions  Emergency treatment [phone] [address] [place], [place] Pregnant women 94 years of age or older with a Medicaid card  [facility] - [facility] For children 57 - 91 years of age:   Cleaning  Tooth brushing/flossing instruction  Sealants, fillings, crowns  Extractions  Emergency treatment Limited orthodontic services for patients with Medicaid [phone] [place], [place] [address] Medicaid and [facility] Health Choice cover for children up to age 82 and pregnant women.  Parents of children up to age 76 without Medicaid pay a reduced fee at time of service.  [facility] Department of [employer] For children 95 - 31 years of age:   Cleaning  Tooth brushing/flossing instruction  Sealants, fillings, crowns  Extractions  Emergency treatment Limited orthodontic services for patients with Medicaid [phone] [address] [place], [place].  Medicaid and [place] Health Choice cover for children up to age 70 and pregnant women.  Parents of children up to age 72 without Medicaid pay a reduced fee.   [facility]  Sealants, fillings, crowns  Extractions  Hours: Tuesdays and Thursdays, 4:15 - 8 pm [phone] [address] [place], [place] [address] Services free of charge to [facility] residents ages 18-64 who do not have health insurance, Medicare, [place], or [place] benefits and fall within federal poverty guidelines  [employer]    Provides dental care in addition to primary medical care, nutritional counseling, and pharmacy:  Nurse, mental health, fillings, crowns  Extractions                  [phone] [facility], [address] [place], [place]  [phone] [facility], [address] [place]  [address] [place], [place]  [phone] [facility] [place], [place]  [phone] [facility], [address] [place], [place]  [phone] [facility] [address] [place], [place] Accepts [place], [place], most insurance.  Also provides services available to all with fees adjusted based on ability to pay.    [facility]  Cleaning  Tooth brushing/flossing instruction  Sealants, fillings, crowns  Extractions  Emergency treatment Hours: Tuesdays, Thursdays, and Fridays from 8 am to 5 pm by appointment only. [phone] [address] [place], [place] [address] [facility] residents with Medicaid (depending on eligibility) and children with [facility] Health Choice - call for more information.  [facility]  Extractions only  Hours: 2nd and 4th Thursday of each month from 6:30 am - 9 am.   [phone] ext. [phone] [place], [place] [address] Ages 25 and older only.  Patients are seen on a first come, first served basis.  [employer]   [employer]  Extractions  Orthodontics  Endodontics  Implants/Crowns/Bridges  Complete and partial dentures [phone] [place], [place] Patients must complete an application for services.  There is often a waiting list.

## 2015-03-22 NOTE — ED Provider Notes (Signed)
CSN: [id number]     Arrival date & time 03/22/15  1205 History   First MD Initiated Contact with Patient 03/22/15 1218     Chief Complaint  Patient presents with  . Dental Pain     (Consider location/radiation/quality/duration/timing/severity/associated sxs/prior Treatment) Patient is a 45 y.o. male presenting with tooth pain. The history is provided by the patient.  Dental Pain Associated symptoms: headaches   Associated symptoms: no fever and no neck pain   Patient c/o onset left lower, posterior, dental pain 1 week ago.  Pt indicates started with 'hole in tooth'. No acute trauma. States area feels swollen.  Pain constant, persistent, mod-severe, worse w chewing. Intermittent dull frontal headaches, c/w prior, gradual in onset. Denies neck pain or stiffness. No throat pain or swelling. No difficulty breathing or swallowing. No fever or chills.       Past Medical History  Diagnosis Date  . Irregular heartbeat   . Chronic back pain   . Chronic lumbar pain   . GERD (gastroesophageal reflux disease)   . Rectal polyp   . Degenerative disc disease    Past Surgical History  Procedure Laterality Date  . Laceration repair      repair to right arm that severed artery  . Arm surgery    . Groin exploration    . Cholecystectomy     Family History  Problem Relation Age of Onset  . Alzheimer's disease Other    Social History  Substance Use Topics  . Smoking status: Current Every Day Smoker -- 1.00 packs/day    Types: Cigarettes  . Smokeless tobacco: Never Used  . Alcohol Use: No    Review of Systems  Constitutional: Negative for fever and chills.  HENT: Negative for sore throat.   Eyes: Negative for pain and redness.  Respiratory: Negative for cough and shortness of breath.   Cardiovascular: Negative for chest pain and leg swelling.  Gastrointestinal: Positive for nausea. Negative for vomiting, abdominal pain and diarrhea.  Genitourinary: Negative for flank pain.   Musculoskeletal: Negative for back pain, neck pain and neck stiffness.  Skin: Negative for rash.  Neurological: Positive for headaches.  Hematological: Does not bruise/bleed easily.  Psychiatric/Behavioral: Negative for confusion.      Allergies  Codeine; Ibuprofen; Ketorolac tromethamine; Nsaids; and Prednisone  Home Medications   Prior to Admission medications   Medication Sig Start Date End Date Taking? Authorizing Provider  acetaminophen (TYLENOL) 500 MG tablet Take 1,000 mg by mouth every 6 (six) hours as needed for mild pain or moderate pain.     Historical Provider, MD  albuterol (PROVENTIL HFA;VENTOLIN HFA) 108 (90 BASE) MCG/ACT inhaler Inhale 1-2 puffs into the lungs every 6 (six) hours as needed for wheezing or shortness of breath. Patient not taking: Reported on 09/08/2014 11/03/13   Janne Napoleon, NP  benzocaine (ORAJEL) 10 % mucosal gel Use as directed 1 application in the mouth or throat as needed for mouth pain.    Historical Provider, MD  ciprofloxacin (CILOXAN) 0.3 % ophthalmic solution Place 2 drops into both eyes every 4 (four) hours while awake. Administer 1 drop, every 2 hours, while awake, for 2 days. Then 1 drop, every 4 hours, while awake, for the next 5 days. 09/08/14   April Palumbo, MD  cyclobenzaprine (FLEXERIL) 10 MG tablet Take 1 tablet (10 mg total) by mouth 2 (two) times daily as needed for muscle spasms. Patient not taking: Reported on 09/08/2014 08/17/14   Emilia Beck, PA-C  fluticasone (FLOVENT  HFA) 110 MCG/ACT inhaler Inhale 1 puff into the lungs 2 (two) times daily. Patient not taking: Reported on 08/16/2014 11/03/13   Janne Napoleon, NP  guaifenesin (ROBITUSSIN) 100 MG/5ML syrup Take 5-10 mLs (100-200 mg total) by mouth every 4 (four) hours as needed for cough or congestion. Patient not taking: Reported on 08/16/2014 11/03/13   Janne Napoleon, NP  HYDROcodone-acetaminophen (NORCO/VICODIN) 5-325 MG per tablet Take 1 tablet by mouth every 6 (six) hours as needed  for moderate pain. Patient not taking: Reported on 08/16/2014 10/07/13   Charlestine Night, PA-C  oxyCODONE-acetaminophen (PERCOCET/ROXICET) 5-325 MG per tablet Take 1 tablet by mouth every 6 (six) hours as needed. Patient not taking: Reported on 08/16/2014 04/01/14   Bethann Berkshire, MD  traMADol (ULTRAM) 50 MG tablet Take 1 tablet (50 mg total) by mouth every 6 (six) hours as needed. Patient not taking: Reported on 09/08/2014 08/17/14   Emilia Beck, PA-C   BP 152/89 mmHg  Pulse 84  Temp(Src) 98.3 F (36.8 C) (Oral)  Resp 18  Ht  (1.778 m)  Wt 97.523 kg  BMI 30.85 kg/m2  SpO2 98% Physical Exam  Constitutional: He is oriented to person, place, and time. He appears well-developed and well-nourished. No distress.  HENT:  Nose: Nose normal.  Mouth/Throat: Oropharynx is clear and moist.  Several absent teeth, and multiple other teeth w caries.  Left lower, two most posterior, remaining teeth, w significant decay, associated gum swelling/tenderness to area. No trismus.  No swelling/erythema to posterior pharynx or to floor of mouth. No neck pain or swelling.   Eyes: Conjunctivae and EOM are normal. Pupils are equal, round, and reactive to light. No scleral icterus.  Pupils 3-4 mm, briskly reactive. No eye pain. No erythema. No pain w eom.   Neck: Neck supple. No tracheal deviation present.  Cardiovascular: Normal rate and intact distal pulses.   Pulmonary/Chest: Effort normal and breath sounds normal. No accessory muscle usage. No respiratory distress.  Abdominal: He exhibits no distension. There is no tenderness.  Musculoskeletal: Normal range of motion. He exhibits no edema.  Lymphadenopathy:    He has no cervical adenopathy.  Neurological: He is alert and oriented to person, place, and time.  Steady gait.   Skin: Skin is warm and dry. No rash noted. He is not diaphoretic.  Psychiatric: He has a normal mood and affect.  Nursing note and vitals reviewed.   ED Course  Procedures  (including critical care time)     MDM   Pt indicates has ride, does not have to drive.    No meds pta.  Confirmed no known abx allergies.  zofran po. vicodin po. Po fluids.    Amoxicillin po.  Discussed need for close dental f/u.  Resource guide provided.  Pt currently appears stable for d/c.      Cathren Laine, MD 03/22/15 1228

## 2016-11-28 ENCOUNTER — Emergency Department (HOSPITAL_COMMUNITY)
Admission: EM | Admit: 2016-11-28 | Discharge: 2016-11-28 | Disposition: A | Discharge status: Home / Self Care | Payer: Self-pay | Attending: Emergency Medicine | Admitting: Emergency Medicine

## 2016-11-28 ENCOUNTER — Encounter (HOSPITAL_COMMUNITY): Payer: Self-pay

## 2016-11-28 ENCOUNTER — Emergency Department (HOSPITAL_COMMUNITY): Payer: Self-pay

## 2016-11-28 DIAGNOSIS — F17210 Nicotine dependence, cigarettes, uncomplicated: Secondary | ICD-10-CM | POA: Insufficient documentation

## 2016-11-28 DIAGNOSIS — M25562 Pain in left knee: Secondary | ICD-10-CM | POA: Insufficient documentation

## 2016-11-28 MED ORDER — HYDROMORPHONE HCL 1 MG/ML IJ SOLN
1.0000 mg | Freq: Once | INTRAMUSCULAR | Status: AC
  Administered 2016-11-28: 1 mg via INTRAMUSCULAR
  Filled 2016-11-28: qty 1

## 2016-11-28 MED ORDER — OXYCODONE-ACETAMINOPHEN 5-325 MG PO TABS: 1.0000 | ORAL_TABLET | ORAL | 0 refills | Status: DC | PRN

## 2016-11-28 NOTE — ED Triage Notes (Signed)
Left knee pain x1 week. Denies injury. Has of fluid on knee per patient.

## 2016-11-28 NOTE — Discharge Instructions (Signed)
Elevate and apply ice packs on/off to your knee.  Call Dr. Harrison's office to arrange a follow-up appt.  °

## 2016-11-29 NOTE — ED Provider Notes (Signed)
[facility] EMERGENCY DEPARTMENT Provider Note   CSN: [id number] Arrival date & time: 11/28/16  1420     History   Chief Complaint Chief Complaint  Patient presents with  . Knee Pain    HPI John Clark is a 46 y.o. male.  HPI   John Clark is a 46 y.o. male who presents to the Emergency Department complaining of left knee pain for one week.  Describes a constant, throbbing pain to the front of the left knee.  States the pain began after crawling underneath a house.  Pain has gradually worsened.  States the knee feels like his left knee "gives away" with weight bearing.  He denies redness, fever, chills, recent illness and hx of previous knee injuries.  He has applied heat and ice without relief.    Past Medical History:  Diagnosis Date  . Chronic back pain   . Chronic lumbar pain   . Degenerative disc disease   . GERD (gastroesophageal reflux disease)   . Irregular heartbeat   . Rectal polyp     There are no active problems to display for this patient.   Past Surgical History:  Procedure Laterality Date  . arm surgery    . CHOLECYSTECTOMY    . GROIN EXPLORATION    . LACERATION REPAIR     repair to right arm that severed artery       Home Medications    Prior to Admission medications   Medication Sig Start Date End Date Taking? Authorizing Provider  oxyCODONE-acetaminophen (PERCOCET/ROXICET) 5-325 MG tablet Take 1 tablet by mouth every 4 (four) hours as needed. 11/28/16   Pauline Ausriplett, Alita Waldren, PA-C    Family History Family History  Problem Relation Age of Onset  . Alzheimer's disease Other     Social History Social History  Substance Use Topics  . Smoking status: Current Every Day Smoker    Packs/day: 1.00    Types: Cigarettes  . Smokeless tobacco: Never Used  . Alcohol use No     Allergies   Codeine; Ibuprofen; Ketorolac tromethamine; Nsaids; and Prednisone   Review of Systems Review of Systems  Constitutional: Negative for chills  and fever.  Genitourinary: Negative for difficulty urinating and dysuria.  Musculoskeletal: Positive for arthralgias and joint swelling.  Skin: Negative for color change and wound.  All other systems reviewed and are negative.    Physical Exam Updated Vital Signs BP (!) 141/99 (BP Location: Right Arm)   Pulse 94   Temp 98.2 F (36.8 C) (Temporal)   Resp 16   Ht 5\' 9"  (1.753 m)   Wt 127 kg (280 lb)   SpO2 98%   BMI 41.35 kg/m   Physical Exam  Constitutional: He is oriented to person, place, and time. He appears well-developed and well-nourished. No distress.  HENT:  Head: Atraumatic.  Cardiovascular: Normal rate, regular rhythm and intact distal pulses.   Pulmonary/Chest: Effort normal and breath sounds normal.  Musculoskeletal: He exhibits edema and tenderness.  Diffuse ttp of the anterior left knee.  Mild patallar crepitus on ROM.  Mild edema.  No erythema or excessive warmth.  No distal edema or tenderness.   Neurological: He is alert and oriented to person, place, and time. No sensory deficit. He exhibits normal muscle tone. Coordination normal.  Skin: Skin is warm and dry. Capillary refill takes less than 2 seconds. No erythema.  Nursing note and vitals reviewed.    ED Treatments / Results  Labs (all labs ordered are  listed, but only abnormal results are displayed) Labs Reviewed - No data to display  EKG  EKG Interpretation None       Radiology Dg Knee Complete 4 Views Left  Result Date: 11/28/2016 CLINICAL DATA:  Left knee pain for 1 week.  Negative trauma history EXAM: LEFT KNEE - COMPLETE 4+ VIEW COMPARISON:  Two days prior FINDINGS: Joint effusion that is stable to decreased from comparison. No fracture or erosion. No malalignment or degenerative narrowing. IMPRESSION: Joint effusion that is stable to decreased from study 2 days prior. No osseous abnormality. Electronically Signed   By: Marnee Spring M.D.   On: 11/28/2016 15:19    Procedures Procedures  (including critical care time)  Medications Ordered in ED Medications  HYDROmorphone (DILAUDID) injection 1 mg (1 mg Intramuscular Given 11/28/16 1558)     Initial Impression / Assessment and Plan / ED Course  I have reviewed the triage vital signs and the nursing notes.  Pertinent labs & imaging results that were available during my care of the patient were reviewed by me and considered in my medical decision making (see chart for details).     Pt was seen two days ago at another facility for knee pain and did not mention this initially.  Pt has full ROM of the joint and bears weight.  Doubt septic joint.  Likely inflammatory.  Pt unable to take NSAID.  I will provide knee sleeve for support and short course of pain medication with understanding that he will arrange f/u with orthopedics.  Pt agrees to plan and appears stable for d/c  Final Clinical Impressions(s) / ED Diagnoses   Final diagnoses:  Acute pain of left knee    New Prescriptions Discharge Medication List as of 11/28/2016  4:00 PM    START taking these medications   Details  oxyCODONE-acetaminophen (PERCOCET/ROXICET) 5-325 MG tablet Take 1 tablet by mouth every 4 (four) hours as needed., Starting Sun 11/28/2016, Print         [place], [place], PA-C 11/29/16 1610    Raeford Razor, MD 11/30/16 1445

## 2017-06-01 ENCOUNTER — Encounter: Payer: Self-pay | Admitting: *Deleted

## 2017-06-02 ENCOUNTER — Other Ambulatory Visit: Payer: Self-pay

## 2017-06-02 ENCOUNTER — Encounter: Payer: Self-pay | Admitting: Cardiology

## 2017-06-02 ENCOUNTER — Encounter: Payer: Self-pay | Admitting: *Deleted

## 2017-06-02 ENCOUNTER — Ambulatory Visit (INDEPENDENT_AMBULATORY_CARE_PROVIDER_SITE_OTHER): Payer: Self-pay | Admitting: Cardiology

## 2017-06-02 VITALS — BP 134/94 | HR 93 | Ht 70.0 in | Wt 290.0 lb

## 2017-06-02 DIAGNOSIS — R079 Chest pain, unspecified: Secondary | ICD-10-CM

## 2017-06-02 DIAGNOSIS — I712 Thoracic aortic aneurysm, without rupture, unspecified: Secondary | ICD-10-CM

## 2017-06-02 DIAGNOSIS — R030 Elevated blood-pressure reading, without diagnosis of hypertension: Secondary | ICD-10-CM

## 2017-06-02 NOTE — Patient Instructions (Signed)
Your physician recommends that you schedule a follow-up appointment in: 1 MONTH WITH DR [facility]  Your physician has recommended you make the following change in your medication:   START ASPIRIN 81 MG DAILY  Your physician has requested that you have an echocardiogram. Echocardiography is a painless test that uses sound waves to create images of your heart. It provides your doctor with information about the size and shape of your heart and how well your heart's chambers and valves are working. This procedure takes approximately one hour. There are no restrictions for this procedure.  Thank you for choosing [facility]!!

## 2017-06-02 NOTE — Progress Notes (Signed)
Clinical Summary Mr. Lucarelli is a 47 y.o.male seen as new patient for chest pain.   1. Chest pain - admit 05/2017 to [facility] with chest pain. Records reviewed in detail - treated for respiraotry infection with zpack, prednisone, and bronchodilators  - started about 1 year ago. Sharp pain, midchest, 8/10 in severity. Can occur at rest or with exertion. No other symptoms. Can be worst with laying down. Can last several hours. Not related to food. Not better with tums or antacids - works [employer] representative. Can have some mild SOB with activities - recent LE edema. Weight up to 290, was 265lbs 2 months ago.   CAD: no pcp and thus unknonwn history, + tobacco x 20 years  2. Aortic aneurysm - 05/2017 CT [facility] 4 cm ascending aneurysm   3. Elevated bp - noted in clnic, has not been diagnosed with HTN previously.    4.Questionable  Long QT interval - reported in records from [facility] - manually measures 400 ms, computer reported 557 which appears to be an error   Past Medical History:  Diagnosis Date  . Chronic back pain   . Chronic lumbar pain   . Degenerative disc disease   . GERD (gastroesophageal reflux disease)   . Irregular heartbeat   . Rectal polyp      Allergies  Allergen Reactions  . Codeine Other (See Comments)    "makes me jittery"  . Ibuprofen Hives  . Ketorolac Tromethamine Other (See Comments)    Makes me "really jittery"  . Nsaids Other (See Comments)    Patient cannot take NSAIDS  . Prednisone Other (See Comments)    Causes jitters      Current Outpatient Medications  Medication Sig Dispense Refill  . oxyCODONE-acetaminophen (PERCOCET/ROXICET) 5-325 MG tablet Take 1 tablet by mouth every 4 (four) hours as needed. 15 tablet 0   No current facility-administered medications for this visit.      Past Surgical History:  Procedure Laterality Date  . arm surgery    . CHOLECYSTECTOMY    . GROIN EXPLORATION    . LACERATION REPAIR     repair to  right arm that severed artery     Allergies  Allergen Reactions  . Codeine Other (See Comments)    "makes me jittery"  . Ibuprofen Hives  . Ketorolac Tromethamine Other (See Comments)    Makes me "really jittery"  . Nsaids Other (See Comments)    Patient cannot take NSAIDS  . Prednisone Other (See Comments)    Causes jitters       Family History  Problem Relation Age of Onset  . Alzheimer's disease Other      Social History Mr. Gladu reports that he has been smoking cigarettes.  He has been smoking about 1.00 pack per day. He has never used smokeless tobacco. Mr. Erhardt reports that he does not drink alcohol.   Review of Systems CONSTITUTIONAL: No weight loss, fever, chills, weakness or fatigue.  HEENT: Eyes: No visual loss, blurred vision, double vision or yellow sclerae.No hearing loss, sneezing, congestion, runny nose or sore throat.  SKIN: No rash or itching.  CARDIOVASCULAR: per hpi RESPIRATORY: per hpi GASTROINTESTINAL: No anorexia, nausea, vomiting or diarrhea. No abdominal pain or blood.  GENITOURINARY: No burning on urination, no polyuria NEUROLOGICAL: No headache, dizziness, syncope, paralysis, ataxia, numbness or tingling in the extremities. No change in bowel or bladder control.  MUSCULOSKELETAL: No muscle, back pain, joint pain or stiffness.  LYMPHATICS:  No enlarged nodes. No history of splenectomy.  PSYCHIATRIC: No history of depression or anxiety.  ENDOCRINOLOGIC: No reports of sweating, cold or heat intolerance. No polyuria or polydipsia.  Marland Kitchen   Physical Examination Vitals:   06/02/17 0954  BP: (!) 134/94  Pulse: 93  SpO2: 96%   Vitals:   06/02/17 0954  Weight: 290 lb (131.5 kg)  Height:  (1.778 m)    Gen: resting comfortably, no acute distress HEENT: no scleral icterus, pupils equal round and reactive, no palptable cervical adenopathy,  CV: RRR, no m/r/g, no jvd Resp: Clear to auscultation bilaterally GI: abdomen is soft,  non-tender, non-distended, normal bowel sounds, no hepatosplenomegaly MSK: extremities are warm, no edema.  Skin: warm, no rash Neuro:  no focal deficits Psych: appropriate affect   Diagnostic Studies  07/2010 cath IMPRESSION:  The patient has no significant coronary artery disease.  He is ruled out myocardial infarction.  He has normal left ventricular function.  He will be discharged later today as long as his groin heals well.   Assessment and Plan  1. Chest pain - recent chest pain symptoms - plan is to obtain an echo, pending results would consider GXT. We will refer him to [facility] to help as he is self pay - start ASA  daily  Would need lipid and cholesterol testing in the near future, would wait until he has some assistance with costs  2. Elevated blood pressure - isolated finding today, continue to monitor at this time  3. Aortic aneurysm - mild, will require continued surveillance. Repeat CTA next year.   F/u 1 month  We will order echo, patient is ok with out of pocket expense. Check on his payment status prior to ordering GXT   Antoine Poche, M.D.

## 2017-06-08 ENCOUNTER — Inpatient Hospital Stay (HOSPITAL_COMMUNITY): Admission: RE | Admit: 2017-06-08 | Payer: Self-pay | Source: Ambulatory Visit

## 2017-06-09 ENCOUNTER — Encounter: Payer: Self-pay | Admitting: Cardiology

## 2017-07-01 ENCOUNTER — Ambulatory Visit: Payer: Self-pay | Admitting: Cardiology

## 2017-11-07 ENCOUNTER — Inpatient Hospital Stay
Admit: 2017-11-07 | Discharge: 2017-11-07 | Disposition: A | Discharge status: Home / Self Care | Payer: Self-pay | Attending: Emergency Medicine

## 2017-11-07 DIAGNOSIS — L02414 Cutaneous abscess of left upper limb: Secondary | ICD-10-CM

## 2017-11-07 MED ORDER — ACETAMINOPHEN-CODEINE 300 MG-30 MG TAB
300-30 mg | ORAL_TABLET | ORAL | 0 refills | Status: AC | PRN
Start: 2017-11-07 — End: 2017-11-10

## 2017-11-07 MED ORDER — TRIMETHOPRIM-SULFAMETHOXAZOLE 160 MG-800 MG TAB
160-800 mg | ORAL_TABLET | Freq: Two times a day (BID) | ORAL | 0 refills | Status: AC
Start: 2017-11-07 — End: 2017-11-17

## 2017-11-07 NOTE — ED Provider Notes (Signed)
ED Provider Notes by Lynnae Prude, PA at 11/07/17 1840                Author: Lynnae Prude, PA  Service: Emergency Medicine  Author Type: Physician Assistant       Filed: 11/07/17 1930  Date of Service: 11/07/17 1840  Status: Attested           Editor: Lynnae Prude, PA (Physician Assistant)  Cosigner: Park Breed, DO at 11/08/17 1311            Procedure Orders        1. I&D Abcess Complex [[id number]] ordered by Lynnae Prude, PA                         Attestation signed by Park Breed, DO at 11/08/17 1311          I was personally available for consultation in the emergency department.  I have reviewed the chart and agree with the documentation recorded by the [facility], including  the assessment, treatment plan, and disposition.   Park Breed, DO                                    EMERGENCY DEPARTMENT HISTORY AND PHYSICAL EXAM      Date: 11/07/2017   Patient Name: Gregory [name]        History of Presenting Illness          Chief Complaint       Patient presents with        ?  Abscess              History Provided By: Patient      Chief Complaint: abscess         Additional History (Context):    6:40 PM   Errin Chewning Cozby is a 47 y.o.  male  presents to the emergency department C/O 2 abscesses for the past week.  He has 1 to the right thigh that is draining and one to the posterior left shoulder.  He denies any fevers.  He has no  history of diabetes or any other medical problems.     PCP: None              Past History        Past Medical History:     Past Medical History:        Diagnosis  Date         ?  Heart aneurysm           ?  Irregular heart beat             Past Surgical History:     Past Surgical History:         Procedure  Laterality  Date          ?  HX CHOLECYSTECTOMY               Family History:   History reviewed. No pertinent family history.      Social History:     Social History          Tobacco Use         ?  Smoking status:  Smoker, Current Status  Unknown     ?  Smokeless tobacco:  Never Used  Substance Use Topics         ?  Alcohol use:  Not Currently         ?  Drug use:  Not on file           Allergies:   No Known Allergies        Review of Systems     Review of Systems    Constitutional: Negative for chills and fever.    Skin: Positive for wound.    All other systems reviewed and are negative.           Physical Exam          Vitals:          11/07/17 1805        BP:  143/77     Pulse:  94     Resp:  20     Temp:  98 ??F (36.7 ??C)     SpO2:  100%     Weight:  126.1 kg (278 lb)        Height:  5\' 10"  (1.778 m)        Physical Exam    Constitutional: He is oriented to person, place, and time. He appears well-developed and well-nourished.   Alert, well-appearing, nontoxic, no acute distress     HENT:    Head: Normocephalic and atraumatic.    Cardiovascular: Normal rate, regular rhythm, normal heart sounds and intact distal pulses.    No murmur heard.   Pulmonary/Chest: Effort normal and breath sounds normal. No respiratory distress. He has no wheezes. He has no rales.   Musculoskeletal:         Back:           Legs:    Neurological: He is alert and oriented to person, place, and time.    Skin:   See musculoskeletal exam   Psychiatric: He has a normal mood and affect. Judgment normal.    Nursing note and vitals reviewed.           Diagnostic Study Results        Labs:    No results found for this or any previous visit (from the past 12 hour(s)).      Radiologic Studies:      No orders to display          CT Results   (Last 48 hours)          None                 CXR Results   (Last 48 hours)          None                    Medical Decision Making     I am the first provider for this patient.      I reviewed the vital signs, available nursing notes, past medical history, past surgical history, family history and social history.      Vital Signs: Reviewed the patient's vital signs.      Pulse Oximetry Analysis: 100% on RA          Records Reviewed: Nursing  Notes and Old Medical Records      Procedures:   I&D Abcess Complex  Date/Time: 11/07/2017 7:08 PM   Performed by:  Lynnae Prude, PA   Authorized by:  Park Breed, DO       Consent:  Consent obtained:  Verbal     Consent given by:  Patient     Risks discussed:  Bleeding, damage to other organs, incomplete drainage, infection and pain   Location:      Type:  Abscess     Location:  Trunk     Trunk location:  Back   Pre-procedure details:      Skin preparation:  Betadine   Anesthesia (see MAR for exact dosages):      Anesthesia method:  Local infiltration     Local anesthetic:  Lidocaine 1% w/o epi   Procedure type:      Complexity:  Complex   Procedure details:      Incision types:  Single straight     Incision depth:  Subcutaneous     Scalpel blade:  11     Wound management:  Probed and deloculated and irrigated with saline     Drainage:  Purulent     Drainage amount:  Moderate     Wound treatment:  Wound left open     Packing materials:  None   Post-procedure details:      Patient tolerance of procedure:  Tolerated well, no immediate complications            ED Course:    6:40 PM Initial assessment performed. The patients presenting problems have been discussed, and they are in agreement with the care plan formulated and outlined with them.  I have encouraged them to ask questions as they arise throughout their visit.      Discussion:   Pt presents with abscess actively draining to the right thigh and abscess that required I&D to the left shoulder.  Will cover with Bactrim.  Patient denies any fevers.  He is nontoxic and well-appearing  afebrile.  He has no history of diabetes or other immunosuppression suppression. Strict return precautions given, pt offering no questions or complaints.        Diagnosis and Disposition        DISCHARGE NOTE:   Elyn AquasDarrell D Carneal's  results have been reviewed with him .  He has been counseled regarding his  diagnosis, treatment, and plan.  He verbally conveys  understanding and agreement of the signs, symptoms, diagnosis, treatment and prognosis  and additionally agrees to follow up as discussed.  He also agrees with the care-plan and conveys that all of  his questions have been answered.  I have also provided discharge instructions for him that  include: educational information regarding their diagnosis and treatment, and list of reasons why they would want to return to the ED prior to their follow-up appointment, should his  condition change. He has been provided with education for proper emergency department utilization.       CLINICAL IMPRESSION:         1.  Abscess            PLAN:   1. D/C Home   2.      Current Discharge Medication List              START taking these medications          Details        trimethoprim-sulfamethoxazole (BACTRIM DS) 160-800 mg per tablet  Take 2 Tabs by mouth two (2) times a day for 10 days.   Qty: 40 Tab, Refills:  0               acetaminophen-codeine (TYLENOL-CODEINE #3) 300-30 mg per tablet  Take 1 Tab by mouth every four (4) hours as needed for Pain for up to 3 days. Max Daily Amount: 6 Tabs.   Qty: 5 Tab, Refills:  0          Associated Diagnoses: Abscess                      3.      Follow-up Information               Follow up With  Specialties  Details  Why  Contact Info              [facility]      call for follow up and recheck   [address] [place] [address]   [place] [address]   [phone]              [facility] EMERGENCY DEPT  Emergency Medicine    If symptoms worsen  [address]   [place] [address]   [phone]                          Please note that this dictation was completed with Dragon, the computer voice recognition software.  Quite often unanticipated grammatical, syntax, homophones, and other interpretive errors are  inadvertently transcribed by the computer software.  Please disregard these errors.  Please excuse any errors that have escaped final proofreading.

## 2017-11-07 NOTE — ED Notes (Signed)
I have reviewed discharge instructions with the patient.  The patient verbalized understanding. Discharge medications reviewed with patient and appropriate educational materials and side effects teaching were provided. Patient armband removed and shredded

## 2017-11-07 NOTE — ED Notes (Signed)
Applied dressing to left shoulder without incident.

## 2017-11-07 NOTE — ED Notes (Signed)
Patient with abscess to the left shoulder and right thigh

## 2017-11-07 NOTE — ED Triage Notes (Signed)
Patient with abscess to the left shoulder and right thigh

## 2017-11-07 NOTE — ED Notes (Signed)
I have reviewed discharge instructions with the patient.  The patient verbalized understanding. Discharge medications reviewed with patient and appropriate educational materials and side effects teaching were provided. Patient armband removed and shredded

## 2017-11-07 NOTE — ED Notes (Signed)
Applied dressing to left shoulder without incident.

## 2017-11-07 NOTE — ED Provider Notes (Signed)
EMERGENCY DEPARTMENT HISTORY AND PHYSICAL EXAM    Date: 11/07/2017  Patient Name: Gregory Gregory [name]    History of Presenting Illness     Chief Complaint   Patient presents with   ??? Abscess         History Provided By: Patient    Chief Complaint: abscess      Additional History (Context):   6:40 PM  Gregory Gregory [name] is Gregory [name] 47 y.o. male  presents to the emergency department C/O 2 abscesses for the past week.  He has 1 to the right thigh that is draining and one to the posterior left shoulder.  He denies any fevers.  He has no history of diabetes or any other medical problems.    PCP: None        Past History     Past Medical History:  Past Medical History:   Diagnosis Date   ??? Heart aneurysm    ??? Irregular heart beat        Past Surgical History:  Past Surgical History:   Procedure Laterality Date   ??? HX CHOLECYSTECTOMY         Family History:  History reviewed. No pertinent family history.    Social History:  Social History     Tobacco Use   ??? Smoking status: Smoker, Current Status Unknown   ??? Smokeless tobacco: Never Used   Substance Use Topics   ??? Alcohol use: Not Currently   ??? Drug use: Not on file       Allergies:  No Known Allergies    Review of Systems   Review of Systems   Constitutional: Negative for chills and fever.   Skin: Positive for wound.   All other systems reviewed and are negative.      Physical Exam     Vitals:    11/07/17 1805   BP: 143/77   Pulse: 94   Resp: 20   Temp: 98 ??F (36.7 ??C)   SpO2: 100%   Weight: 126.1 kg (278 lb)   Height: 5\' 10"  (1.778 Gregory [name])     Physical Exam   Constitutional: He is oriented to person, place, and time. He appears well-developed and well-nourished.   Alert, well-appearing, nontoxic, no acute distress   HENT:   Head: Normocephalic and atraumatic.   Cardiovascular: Normal rate, regular rhythm, normal heart sounds and intact distal pulses.   No murmur heard.  Pulmonary/Chest: Effort normal and breath sounds normal. No respiratory distress. He has no wheezes. He has no rales.    Musculoskeletal:        Back:         Legs:  Neurological: He is alert and oriented to person, place, and time.   Skin:   See musculoskeletal exam   Psychiatric: He has Gregory [name] normal mood and affect. Judgment normal.   Nursing note and vitals reviewed.      Diagnostic Study Results     Labs:   No results found for this or any previous visit (from the past 12 hour(s)).    Radiologic Studies:   No orders to display     CT Results  (Last 48 hours)    None        CXR Results  (Last 48 hours)    None          Medical Decision Making   I am the first provider for this patient.    I reviewed the vital signs, available nursing notes, past medical  history, past surgical history, family history and social history.    Vital Signs: Reviewed the patient's vital signs.    Pulse Oximetry Analysis: 100% on RA       Records Reviewed: Nursing Notes and Old Medical Records    Procedures:  I&D Abcess Complex  Date/Time: 11/07/2017 7:08 PM  Performed by: Gregory Gregory [name], Gregory Mergenthaler Gregory [name], Gregory Gregory [name]  Authorized by: Gregory Gregory [name], Gregory Gregory [name], Gregory Gregory [name]     Consent:     Consent obtained:  Verbal    Consent given by:  Patient    Risks discussed:  Bleeding, damage to other organs, incomplete drainage, infection and pain  Location:     Type:  Abscess    Location:  Trunk    Trunk location:  Back  Pre-procedure details:     Skin preparation:  Betadine  Anesthesia (see MAR for exact dosages):     Anesthesia method:  Local infiltration    Local anesthetic:  Lidocaine 1% w/o epi  Procedure type:     Complexity:  Complex  Procedure details:     Incision types:  Single straight    Incision depth:  Subcutaneous    Scalpel blade:  11    Wound management:  Probed and deloculated and irrigated with saline    Drainage:  Purulent    Drainage amount:  Moderate    Wound treatment:  Wound left open    Packing materials:  None  Post-procedure details:     Patient tolerance of procedure:  Tolerated well, no immediate complications        ED Course:    6:40 PM Initial assessment performed. The patients presenting problems have been discussed, and they are in agreement with the care plan formulated and outlined with them.  I have encouraged them to ask questions as they arise throughout their visit.    Discussion:  Pt presents with abscess actively draining to the right thigh and abscess that required I&D to the left shoulder.  Will cover with Bactrim.  Patient denies any fevers.  He is nontoxic and well-appearing afebrile.  He has no history of diabetes or other immunosuppression suppression. Strict return precautions given, pt offering no questions or complaints.    Diagnosis and Disposition     DISCHARGE NOTE:  Gregory Gregory [name]  results have been reviewed with him.  He has been counseled regarding his diagnosis, treatment, and plan.  He verbally conveys understanding and agreement of the signs, symptoms, diagnosis, treatment and prognosis and additionally agrees to follow up as discussed.  He also agrees with the care-plan and conveys that all of his questions have been answered.  I have also provided discharge instructions for him that include: educational information regarding their diagnosis and treatment, and list of reasons why they would want to return to the ED prior to their follow-up appointment, should his condition change. He has been provided with education for proper emergency department utilization.     CLINICAL IMPRESSION:    1. Abscess        PLAN:  1. D/C Home  2.   Current Discharge Medication List      START taking these medications    Details   trimethoprim-sulfamethoxazole (BACTRIM DS) 160-800 mg per tablet Take 2 Tabs by mouth two (2) times Gregory [name] day for 10 days.  Qty: 40 Tab, Refills: 0      acetaminophen-codeine (TYLENOL-CODEINE #3) 300-30 mg per tablet Take 1 Tab by mouth every four (4) hours as needed for Pain for up to 3  days. Max Daily Amount: 6 Tabs.  Qty: 5 Tab, Refills: 0    Associated Diagnoses: Abscess           3.    Follow-up Information     Follow up With Specialties Details Why Contact Info    [facility] CLINIC   call for follow up and recheck  [address] [place] [address]  [place] [address]  [phone]    [facility] EMERGENCY DEPT Emergency Medicine  If symptoms worsen [address]  [place] [address]  [phone]                 Please note that this dictation was completed with Dragon, the computer voice recognition software.  Quite often unanticipated grammatical, syntax, homophones, and other interpretive errors are inadvertently transcribed by the computer software.  Please disregard these errors.  Please excuse any errors that have escaped final proofreading.

## 2018-02-01 DIAGNOSIS — I4891 Unspecified atrial fibrillation: Secondary | ICD-10-CM

## 2018-02-01 HISTORY — DX: Unspecified atrial fibrillation: I48.91

## 2018-04-28 ENCOUNTER — Other Ambulatory Visit: Payer: Self-pay

## 2018-04-28 ENCOUNTER — Emergency Department (HOSPITAL_COMMUNITY): Payer: Self-pay

## 2018-04-28 ENCOUNTER — Emergency Department (HOSPITAL_COMMUNITY)
Admission: EM | Admit: 2018-04-28 | Discharge: 2018-04-28 | Disposition: A | Discharge status: Home / Self Care | Payer: Self-pay | Attending: Emergency Medicine | Admitting: Emergency Medicine

## 2018-04-28 ENCOUNTER — Encounter (HOSPITAL_COMMUNITY): Payer: Self-pay

## 2018-04-28 DIAGNOSIS — I714 Abdominal aortic aneurysm, without rupture: Secondary | ICD-10-CM | POA: Insufficient documentation

## 2018-04-28 DIAGNOSIS — Z87891 Personal history of nicotine dependence: Secondary | ICD-10-CM | POA: Insufficient documentation

## 2018-04-28 DIAGNOSIS — R079 Chest pain, unspecified: Secondary | ICD-10-CM

## 2018-04-28 DIAGNOSIS — Z7982 Long term (current) use of aspirin: Secondary | ICD-10-CM | POA: Insufficient documentation

## 2018-04-28 DIAGNOSIS — R072 Precordial pain: Secondary | ICD-10-CM | POA: Insufficient documentation

## 2018-04-28 HISTORY — DX: Aneurysm of unspecified site: I72.9

## 2018-04-28 LAB — COMPREHENSIVE METABOLIC PANEL
ALT: 50 U/L — AB (ref 0–44)
AST: 36 U/L (ref 15–41)
Albumin: 4.5 g/dL (ref 3.5–5.0)
Alkaline Phosphatase: 81 U/L (ref 38–126)
Anion gap: 10 (ref 5–15)
BILIRUBIN TOTAL: 0.6 mg/dL (ref 0.3–1.2)
BUN: 24 mg/dL — AB (ref 6–20)
CALCIUM: 8.6 mg/dL — AB (ref 8.9–10.3)
CHLORIDE: 104 mmol/L (ref 98–111)
CO2: 23 mmol/L (ref 22–32)
CREATININE: 1.39 mg/dL — AB (ref 0.61–1.24)
Glucose, Bld: 113 mg/dL — ABNORMAL HIGH (ref 70–99)
Potassium: 3.5 mmol/L (ref 3.5–5.1)
Sodium: 137 mmol/L (ref 135–145)
TOTAL PROTEIN: 7.5 g/dL (ref 6.5–8.1)

## 2018-04-28 LAB — CBC WITH DIFFERENTIAL/PLATELET
Abs Immature Granulocytes: 0.05 10*3/uL (ref 0.00–0.07)
BASOS ABS: 0.1 10*3/uL (ref 0.0–0.1)
BASOS PCT: 1 %
EOS ABS: 0 10*3/uL (ref 0.0–0.5)
EOS PCT: 0 %
HCT: 43.9 % (ref 39.0–52.0)
HEMOGLOBIN: 15 g/dL (ref 13.0–17.0)
Immature Granulocytes: 0 %
Lymphocytes Relative: 17 %
Lymphs Abs: 2.3 10*3/uL (ref 0.7–4.0)
MCH: 31.4 pg (ref 26.0–34.0)
MCHC: 34.2 g/dL (ref 30.0–36.0)
MCV: 91.8 fL (ref 80.0–100.0)
MONO ABS: 0.7 10*3/uL (ref 0.1–1.0)
Monocytes Relative: 5 %
NRBC: 0 % (ref 0.0–0.2)
Neutro Abs: 10.6 10*3/uL — ABNORMAL HIGH (ref 1.7–7.7)
Neutrophils Relative %: 77 %
PLATELETS: 200 10*3/uL (ref 150–400)
RBC: 4.78 MIL/uL (ref 4.22–5.81)
RDW: 13 % (ref 11.5–15.5)
WBC: 13.7 10*3/uL — AB (ref 4.0–10.5)

## 2018-04-28 LAB — TROPONIN I

## 2018-04-28 MED ORDER — ALUM & MAG HYDROXIDE-SIMETH 200-200-20 MG/5ML PO SUSP
30.0000 mL | Freq: Once | ORAL | Status: AC
  Administered 2018-04-28: 30 mL via ORAL
  Filled 2018-04-28: qty 30

## 2018-04-28 MED ORDER — IOHEXOL 350 MG/ML SOLN
100.0000 mL | Freq: Once | INTRAVENOUS | Status: AC | PRN
  Administered 2018-04-28: 100 mL via INTRAVENOUS

## 2018-04-28 MED ORDER — MORPHINE SULFATE (PF) 4 MG/ML IV SOLN
4.0000 mg | Freq: Once | INTRAVENOUS | Status: AC
  Administered 2018-04-28: 4 mg via INTRAVENOUS
  Filled 2018-04-28: qty 1

## 2018-04-28 MED ORDER — LIDOCAINE VISCOUS HCL 2 % MT SOLN
15.0000 mL | Freq: Once | OROMUCOSAL | Status: AC
  Administered 2018-04-28: 15 mL via ORAL
  Filled 2018-04-28: qty 15

## 2018-04-28 MED ORDER — ONDANSETRON HCL 4 MG/2ML IJ SOLN
4.0000 mg | Freq: Once | INTRAMUSCULAR | Status: AC
  Administered 2018-04-28: 4 mg via INTRAVENOUS
  Filled 2018-04-28: qty 2

## 2018-04-28 MED ORDER — MORPHINE SULFATE (PF) 4 MG/ML IV SOLN
INTRAVENOUS | Status: AC
  Filled 2018-04-28: qty 1

## 2018-04-28 MED ORDER — MORPHINE SULFATE (PF) 4 MG/ML IV SOLN
4.0000 mg | Freq: Once | INTRAVENOUS | Status: AC
  Administered 2018-04-28: 4 mg via INTRAVENOUS

## 2018-04-28 MED ORDER — HYDROCODONE-ACETAMINOPHEN 5-325 MG PO TABS: 1.0000 | ORAL_TABLET | ORAL | 0 refills | Status: AC | PRN

## 2018-04-28 NOTE — ED Provider Notes (Signed)
[facility] EMERGENCY DEPARTMENT Provider Note   CSN: [id number] Arrival date & time: 04/28/18  0203    History   Chief Complaint Chief Complaint  Patient presents with  . Chest Pain    HPI John Clark is a 48 y.o. male.     Patient is a 48 year old male with past medical history of what sounds like an ascending aortic aneurysm based on what the patient has described to me.  He tells me one year ago it was measured at 4.5 cm and was supposed to be re-studied one year ago, but he was lost to follow-up.  He started this morning with a pressure to the center of his chest.  He initially thought that this was heartburn and has been taking Tums throughout the day.  This has provided little relief.  He reports pain radiating into his left arm and he feels short of breath.  The history is provided by the patient.  Chest Pain  Pain location:  Substernal area Pain quality: pressure   Pain radiates to:  L arm Pain severity:  Moderate Onset quality:  Sudden Duration:  12 hours Timing:  Constant Progression:  Worsening Chronicity:  New Relieved by:  Nothing Worsened by:  Nothing Ineffective treatments:  None tried   Past Medical History:  Diagnosis Date  . Aneurysm (HCC)   . Chronic back pain   . Chronic lumbar pain   . Degenerative disc disease   . GERD (gastroesophageal reflux disease)   . Irregular heartbeat   . Rectal polyp     There are no active problems to display for this patient.   Past Surgical History:  Procedure Laterality Date  . arm surgery    . CHOLECYSTECTOMY    . GROIN EXPLORATION    . LACERATION REPAIR     repair to right arm that severed artery        Home Medications    Prior to Admission medications   Medication Sig Start Date End Date Taking? Authorizing Provider  aspirin EC 81 MG tablet Take 81 mg by mouth daily.    [provider]    Family History Family History  Problem Relation Age of Onset  . Alzheimer's disease  Other     Social History Social History   Tobacco Use  . Smoking status: Former Smoker    Packs/day: 1.00    Types: Cigarettes    Last attempt to quit: 05/18/2017    Years since quitting: 0.9  . Smokeless tobacco: Never Used  Substance Use Topics  . Alcohol use: No  . Drug use: No     Allergies   Codeine; Ibuprofen; Ketorolac tromethamine; Nsaids; and Prednisone   Review of Systems Review of Systems  Cardiovascular: Positive for chest pain.  All other systems reviewed and are negative.    Physical Exam Updated Vital Signs BP (!) 183/113 (BP Location: Right Arm)   Pulse (!) 103   Temp 98.3 F (36.8 C) (Oral)   Resp 15   Ht 5\' 10"  (1.778 m)   Wt 117.9 kg   SpO2 98%   BMI 37.31 kg/m   Physical Exam Vitals signs and nursing note reviewed.  Constitutional:      General: He is not in acute distress.    Appearance: He is well-developed. He is not diaphoretic.  HENT:     Head: Normocephalic and atraumatic.  Neck:     Musculoskeletal: Normal range of motion and neck supple.  Cardiovascular:  Rate and Rhythm: Normal rate and regular rhythm.     Heart sounds: No murmur. No friction rub.  Pulmonary:     Effort: Pulmonary effort is normal. No respiratory distress.     Breath sounds: Normal breath sounds. No wheezing or rales.  Abdominal:     General: Bowel sounds are normal. There is no distension.     Palpations: Abdomen is soft.     Tenderness: There is no abdominal tenderness.  Musculoskeletal: Normal range of motion.     Right lower leg: He exhibits no tenderness. No edema.     Left lower leg: He exhibits no tenderness. No edema.     Comments: DP pulses are palpable bilaterally.  Skin:    General: Skin is warm and dry.  Neurological:     Mental Status: He is alert and oriented to person, place, and time.     Coordination: Coordination normal.      ED Treatments / Results  Labs (all labs ordered are listed, but only abnormal results are displayed)  Labs Reviewed  CBC WITH DIFFERENTIAL/PLATELET - Abnormal; Notable for the following components:      Result Value   WBC 13.7 (*)    Neutro Abs 10.6 (*)    All other components within normal limits  COMPREHENSIVE METABOLIC PANEL  TROPONIN I    EKG None  Radiology No results found.  Procedures Procedures (including critical care time)  Medications Ordered in ED Medications  ondansetron (ZOFRAN) injection 4 mg (4 mg Intravenous Given 04/28/18 0231)  morphine 4 MG/ML injection 4 mg (4 mg Intravenous Given 04/28/18 0231)     Initial Impression / Assessment and Plan / ED Course  I have reviewed the triage vital signs and the nursing notes.  Pertinent labs & imaging results that were available during my care of the patient were reviewed by me and considered in my medical decision making (see chart for details).  Patient with history of ascending aortic aneurysm presenting here with complaints of chest pain.  His pain is been ongoing nearly 24 hours and his EKG is unchanged and troponin is negative.  I highly doubt a cardiac etiology.  CT scan of the chest, abdomen, and pelvis shows no evidence for aortic dissection or complication of the aneurysm.  The aneurysm is stable in size from 1 year ago.  Patient is feeling better after receiving medications here in the ER including morphine and a GI cocktail.  I suspect either a musculoskeletal or possibly a GI etiology.  Patient will be treated with pain medication, rest, and is to follow-up with primary doctor if not improving.  Final Clinical Impressions(s) / ED Diagnoses   Final diagnoses:  None    ED Discharge Orders    None       Geoffery Lyons, MD 04/28/18 [id number]

## 2018-04-28 NOTE — Discharge Instructions (Addendum)
Hydrocodone as prescribed as needed for pain.  Follow-up with your primary doctor if symptoms or not improving in the next few days.

## 2018-04-28 NOTE — ED Triage Notes (Signed)
Pt said he started having cp this morning. Feels like hi s chest is being squeezed. Pain going down arm. Dx with "4.5 aneurysm" last year. Took tums and bc powder  Pta. Not on any blood thinners. Does admit to drinking 4 energy drinks daily.

## 2018-08-13 ENCOUNTER — Other Ambulatory Visit: Payer: Self-pay

## 2018-08-13 ENCOUNTER — Encounter (HOSPITAL_BASED_OUTPATIENT_CLINIC_OR_DEPARTMENT_OTHER): Payer: Self-pay | Admitting: Emergency Medicine

## 2018-08-13 ENCOUNTER — Emergency Department (HOSPITAL_BASED_OUTPATIENT_CLINIC_OR_DEPARTMENT_OTHER): Payer: Self-pay

## 2018-08-13 ENCOUNTER — Emergency Department (HOSPITAL_BASED_OUTPATIENT_CLINIC_OR_DEPARTMENT_OTHER)
Admission: EM | Admit: 2018-08-13 | Discharge: 2018-08-13 | Disposition: A | Discharge status: Home / Self Care | Payer: Self-pay | Attending: Emergency Medicine | Admitting: Emergency Medicine

## 2018-08-13 DIAGNOSIS — Z20822 Contact with and (suspected) exposure to covid-19: Secondary | ICD-10-CM

## 2018-08-13 DIAGNOSIS — Z7982 Long term (current) use of aspirin: Secondary | ICD-10-CM | POA: Insufficient documentation

## 2018-08-13 DIAGNOSIS — J40 Bronchitis, not specified as acute or chronic: Secondary | ICD-10-CM | POA: Insufficient documentation

## 2018-08-13 DIAGNOSIS — Z20828 Contact with and (suspected) exposure to other viral communicable diseases: Secondary | ICD-10-CM | POA: Insufficient documentation

## 2018-08-13 DIAGNOSIS — F17210 Nicotine dependence, cigarettes, uncomplicated: Secondary | ICD-10-CM | POA: Insufficient documentation

## 2018-08-13 DIAGNOSIS — R0602 Shortness of breath: Secondary | ICD-10-CM

## 2018-08-13 DIAGNOSIS — Z79899 Other long term (current) drug therapy: Secondary | ICD-10-CM | POA: Insufficient documentation

## 2018-08-13 LAB — CBC WITH DIFFERENTIAL/PLATELET
Abs Immature Granulocytes: 0.05 10*3/uL (ref 0.00–0.07)
Basophils Absolute: 0.1 10*3/uL (ref 0.0–0.1)
Basophils Relative: 1 %
Eosinophils Absolute: 0.2 10*3/uL (ref 0.0–0.5)
Eosinophils Relative: 2 %
HCT: 42.6 % (ref 39.0–52.0)
Hemoglobin: 13.6 g/dL (ref 13.0–17.0)
Immature Granulocytes: 1 %
Lymphocytes Relative: 22 %
Lymphs Abs: 2.2 10*3/uL (ref 0.7–4.0)
MCH: 30.2 pg (ref 26.0–34.0)
MCHC: 31.9 g/dL (ref 30.0–36.0)
MCV: 94.5 fL (ref 80.0–100.0)
Monocytes Absolute: 0.9 10*3/uL (ref 0.1–1.0)
Monocytes Relative: 9 %
Neutro Abs: 6.5 10*3/uL (ref 1.7–7.7)
Neutrophils Relative %: 65 %
Platelets: 177 10*3/uL (ref 150–400)
RBC: 4.51 MIL/uL (ref 4.22–5.81)
RDW: 12.7 % (ref 11.5–15.5)
WBC: 9.9 10*3/uL (ref 4.0–10.5)
nRBC: 0 % (ref 0.0–0.2)

## 2018-08-13 LAB — BASIC METABOLIC PANEL
Anion gap: 10 (ref 5–15)
BUN: 20 mg/dL (ref 6–20)
CO2: 24 mmol/L (ref 22–32)
Calcium: 8.4 mg/dL — ABNORMAL LOW (ref 8.9–10.3)
Chloride: 103 mmol/L (ref 98–111)
Creatinine, Ser: 0.98 mg/dL (ref 0.61–1.24)
GFR calc Af Amer: >60 mL/min (ref >60)
GFR calc non Af Amer: >60 mL/min (ref >60)
Glucose, Bld: 103 mg/dL — ABNORMAL HIGH (ref 70–99)
Potassium: 3.8 mmol/L (ref 3.5–5.1)
Sodium: 137 mmol/L (ref 135–145)

## 2018-08-13 MED ORDER — ALBUTEROL SULFATE HFA 108 (90 BASE) MCG/ACT IN AERS
6.0000 | INHALATION_SPRAY | Freq: Once | RESPIRATORY_TRACT | Status: AC
  Administered 2018-08-13: 6 via RESPIRATORY_TRACT
  Filled 2018-08-13: qty 6.7

## 2018-08-13 MED ORDER — ACETAMINOPHEN 500 MG PO TABS
1000.0000 mg | ORAL_TABLET | Freq: Once | ORAL | Status: AC
  Administered 2018-08-13: 1000 mg via ORAL
  Filled 2018-08-13: qty 2

## 2018-08-13 MED ORDER — PREDNISONE 20 MG PO TABS
40.0000 mg | ORAL_TABLET | Freq: Once | ORAL | Status: AC
  Administered 2018-08-13: 40 mg via ORAL
  Filled 2018-08-13: qty 2

## 2018-08-13 MED ORDER — ONDANSETRON HCL 4 MG/2ML IJ SOLN
INTRAMUSCULAR | Status: AC
  Filled 2018-08-13: qty 2

## 2018-08-13 MED ORDER — ONDANSETRON HCL 4 MG/2ML IJ SOLN
4.0000 mg | Freq: Once | INTRAMUSCULAR | Status: AC
  Administered 2018-08-13: 4 mg via INTRAVENOUS

## 2018-08-13 MED ORDER — IPRATROPIUM BROMIDE HFA 17 MCG/ACT IN AERS
3.0000 | INHALATION_SPRAY | Freq: Once | RESPIRATORY_TRACT | Status: AC
  Administered 2018-08-13: 3 via RESPIRATORY_TRACT
  Filled 2018-08-13: qty 12.9

## 2018-08-13 MED ORDER — PREDNISONE 10 MG PO TABS: 40.0000 mg | ORAL_TABLET | Freq: Every day | ORAL | 0 refills | Status: AC

## 2018-08-13 NOTE — ED Notes (Signed)
ED Provider at bedside. 

## 2018-08-13 NOTE — ED Triage Notes (Signed)
Pt reports shortness of breath, generalized weakness, throat itching since traveling to [place] on Thursday. Reports cough, decrease in appetite.

## 2018-08-13 NOTE — ED Notes (Signed)
X-ray at bedside

## 2018-08-13 NOTE — Discharge Instructions (Signed)
Take 4 puffs of the albuterol every 4-6 hrs scheduled for the rest of the day. Then you can take 2-4 puffs every 4-6 hrs as needed for shortness of breath or wheezing.

## 2018-08-13 NOTE — ED Notes (Signed)
Pt reporting no improvement in shortness of breath, LS improved to ausculation. MD made aware

## 2018-08-13 NOTE — ED Provider Notes (Signed)
[facility] EMERGENCY DEPARTMENT Provider Note  CSN: [id number] Arrival date & time: 08/13/18 [id number]  Chief Complaint(s) Shortness of Breath  HPI John Clark is a 48 y.o. male with a past medical history listed below who presents to the emergency department with 4 days of gradually worsening myalgias, low-grade temp, cough/congestion, shortness of breath and chest tightness that has been constant since onset and exacerbated with coughing.  Chest tightness is nonradiating and nonexertional.  Cough is intermittently productive of clear sputum.  Denies any known sick contacts.  Reports travel to [place] last week for work.  Some nausea without emesis.  No diarrhea.  No abdominal pain.  The history is provided by the patient.    Past Medical History Past Medical History:  Diagnosis Date  . Aneurysm (HCC)   . Chronic back pain   . Chronic lumbar pain   . Degenerative disc disease   . GERD (gastroesophageal reflux disease)   . Irregular heartbeat   . Rectal polyp    There are no active problems to display for this patient.  Home Medication(s) Prior to Admission medications   Medication Sig Start Date End Date Taking? Authorizing Provider  aspirin EC 81 MG tablet Take 81 mg by mouth daily.    [provider]  HYDROcodone-acetaminophen (NORCO) 5-325 MG tablet Take 1-2 tablets by mouth every 4 (four) hours as needed. 04/28/18   Geoffery Lyonselo, Douglas, MD  predniSONE (DELTASONE) 10 MG tablet Take 4 tablets (40 mg total) by mouth daily for 4 days. 08/14/18 08/18/18  Nira Connardama, Pedro Eduardo, MD                                                                                                                                    Past Surgical History Past Surgical History:  Procedure Laterality Date  . arm surgery    . CHOLECYSTECTOMY    . GROIN EXPLORATION    . 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 Alzheimer's disease Other     Social History Social History   Tobacco Use  . Smoking status: Current Every Day Smoker    Packs/day: 0.50    Types: Cigarettes    Last attempt to quit: 05/18/2017    Years since quitting: 1.2  . Smokeless tobacco: Never Used  Substance Use Topics  . Alcohol use: No  . Drug use: No   Allergies Codeine, Ibuprofen, Ketorolac tromethamine, Nsaids, and Prednisone  Review of Systems Review of Systems All other systems are reviewed and are negative for acute change except as noted in the HPI  Physical Exam Vital Signs  I have reviewed the triage vital signs BP 139/83 (BP Location: Right Arm)   Pulse 97   Temp 99.4 F (37.4 C) (Oral)   Resp 18   Ht 5\' 11"  (1.803  m)   Wt 117.9 kg   SpO2 100%   BMI 36.26 kg/m   Physical Exam Vitals signs reviewed.  Constitutional:      General: He is not in acute distress.    Appearance: He is well-developed. He is not diaphoretic.  HENT:     Head: Normocephalic and atraumatic.     Nose: Nose normal.  Eyes:     General: No scleral icterus.       Right eye: No discharge.        Left eye: No discharge.     Conjunctiva/sclera: Conjunctivae normal.     Pupils: Pupils are equal, round, and reactive to light.  Neck:     Musculoskeletal: Normal range of motion and neck supple.  Cardiovascular:     Rate and Rhythm: Normal rate and regular rhythm.     Heart sounds: No murmur. No friction rub. No gallop.   Pulmonary:     Effort: Pulmonary effort is normal. No respiratory distress.     Breath sounds: No stridor. Examination of the right-upper field reveals wheezing. Examination of the left-upper field reveals wheezing. Examination of the right-middle field reveals wheezing. Examination of the left-middle field reveals wheezing. Examination of the right-lower field reveals wheezing. Examination of the left-lower field reveals wheezing. Wheezing present. No rales.  Abdominal:     General: There is no  distension.     Palpations: Abdomen is soft.     Tenderness: There is no abdominal tenderness.  Musculoskeletal:        General: No tenderness.     Right lower leg: No edema.     Left lower leg: No edema.  Skin:    General: Skin is warm and dry.     Findings: No erythema or rash.  Neurological:     Mental Status: He is alert and oriented to person, place, and time.     ED Results and Treatments Labs (all labs ordered are listed, but only abnormal results are displayed) Labs Reviewed  BASIC METABOLIC PANEL - Abnormal; Notable for the following components:      Result Value   Glucose, Bld 103 (*)    Calcium 8.4 (*)    All other components within normal limits  NOVEL CORONAVIRUS, NAA (HOSPITAL ORDER, SEND-OUT TO REF LAB)  CBC WITH DIFFERENTIAL/PLATELET                                                                                                                         EKG  EKG Interpretation  Date/Time:  Sunday August 13 2018 02:57:28 EDT Ventricular Rate:  91 PR Interval:    QRS Duration: 148 QT Interval:  419 QTC Calculation: 516 R Axis:   -7 Text Interpretation:  Sinus rhythm Atrial premature complex Nonspecific intraventricular conduction delay No significant change since last tracing Confirmed by Addison Lank [phone]) on 08/13/2018 4:53:32 AM      Radiology Dg Chest Port 1 View  Result Date: 08/13/2018 CLINICAL DATA:  Initial evaluation for acute cough, shortness of breath, generalized body aches. EXAM: PORTABLE CHEST 1 VIEW COMPARISON:  Prior CT from 07/07/2018. FINDINGS: Cardiac and mediastinal silhouettes are stable in size and contour, and remain within normal limits. Lungs normally inflated. No focal infiltrates. No edema or effusion. No pneumothorax. No acute osseous finding. IMPRESSION: No radiographic evidence for active cardiopulmonary disease. Electronically Signed   By: Rise MuBenjamin  McClintock M.D.   On: 08/13/2018 04:02    Pertinent labs & imaging results  that were available during my care of the patient were reviewed by me and considered in my medical decision making (see chart for details).  Medications Ordered in ED Medications  predniSONE (DELTASONE) tablet 40 mg (has no administration in time range)  albuterol (VENTOLIN HFA) 108 (90 Base) MCG/ACT inhaler 6 puff (6 puffs Inhalation Given 08/13/18 0425)  ipratropium (ATROVENT HFA) inhaler 3 puff (3 puffs Inhalation Given 08/13/18 0425)  acetaminophen (TYLENOL) tablet 1,000 mg (1,000 mg Oral Given 08/13/18 0425)  ondansetron (ZOFRAN) injection 4 mg (4 mg Intravenous Given 08/13/18 0441)                                                                                                                                    Procedures Procedures  (including critical care time)  Medical Decision Making / ED Course I have reviewed the nursing notes for this encounter and the patient's prior records (if available in EHR or on provided paperwork).   John MuslimDarrell D Bragdon was evaluated in Emergency Department on 08/13/2018 for the symptoms described in the history of present illness. He was evaluated in the context of the global COVID-19 pandemic, which necessitated consideration that the patient might be at risk for infection with the SARS-CoV-2 virus that causes COVID-19. Institutional protocols and algorithms that pertain to the evaluation of patients at risk for COVID-19 are in a state of rapid change based on information released by regulatory bodies including the CDC and federal and state organizations. These policies and algorithms were followed during the patient's care in the ED.  Patient presents with viral-like illness.  Wheezing on exam.  Patient is a chronic smoker.  Likely bronchitis from viral process.  Chest x-ray without evidence of pneumonia, pneumothorax, pulmonary edema.  No evidence of volume overload on exam concerning for heart failure.  Doubt PE.  Possible COVID.  Test obtain and sent out.   Patient provided with albuterol inhaler and Atrovent inhaler.  Oral prednisone. Wheezing improved.  The patient appears reasonably screened and/or stabilized for discharge and I doubt any other medical condition or other [facility] requiring further screening, evaluation, or treatment in the ED at this time prior to discharge.  The patient is safe for discharge with strict return precautions.       Final Clinical Impression(s) / ED Diagnoses Final diagnoses:  SOB (shortness of breath)  Bronchitis  Suspected Covid-19 Virus Infection    The patient appears reasonably  screened and/or stabilized for discharge and I doubt any other medical condition or other [facility] requiring further screening, evaluation, or treatment in the ED at this time prior to discharge.  Disposition: Discharge  Condition: Good  I have discussed the results, Dx and Tx plan with the patient who expressed understanding and agree(s) with the plan. Discharge instructions discussed at great length. The patient was given strict return precautions who verbalized understanding of the instructions. No further questions at time of discharge.    ED Discharge Orders         Ordered    predniSONE (DELTASONE) 10 MG tablet  Daily     08/13/18 0600           Follow Up: Primary care provider  Schedule an appointment as soon as possible for a visit        This chart was dictated using voice recognition software.  Despite best efforts to proofread,  errors can occur which can change the documentation meaning.   Nira Connardama, Pedro Eduardo, MD 08/13/18 [phone]

## 2018-08-13 NOTE — ED Notes (Signed)
Also complaining of chest pain. EKG performed per protocol.

## 2018-08-13 NOTE — ED Notes (Signed)
Pt provided educational information regarding coronavirus, [place] and [place] Department of Health guidelines. Pt hesitant about self isolating, states "we don't know if I have it." Explained to patient that he should assume he has it until test results return. Explained concept of social distancing and quarantine.Pt signed [place] dept of health quarantine agreement, however based on interview with patient, pt will likely not follow instructions.

## 2018-08-14 LAB — NOVEL CORONAVIRUS, NAA (HOSP ORDER, SEND-OUT TO REF LAB; TAT 18-24 HRS): SARS-CoV-2, NAA: NOT DETECTED

## 2018-09-25 ENCOUNTER — Emergency Department: Admit: 2018-09-25

## 2018-09-25 ENCOUNTER — Inpatient Hospital Stay
Admit: 2018-09-25 | Discharge: 2018-09-25 | Disposition: A | Discharge status: Home / Self Care | Attending: Emergency Medicine

## 2018-09-25 DIAGNOSIS — J028 Acute pharyngitis due to other specified organisms: Secondary | ICD-10-CM

## 2018-09-25 LAB — CBC WITH AUTO DIFFERENTIAL
Band Neutrophils: 1 %
Basophils %: 0 % (ref 0–1)
Basophils Absolute: 0 10*3/uL (ref 0.0–0.1)
Eosinophils %: 0 % (ref 0–7)
Eosinophils Absolute: 0 10*3/uL (ref 0.0–0.4)
Granulocyte Absolute Count: 0 10*3/uL (ref 0.00–0.04)
Hematocrit: 39.8 % (ref 36.6–50.3)
Hemoglobin: 13.6 g/dL (ref 12.1–17.0)
Immature Granulocytes: 0 % (ref 0.0–0.5)
Lymphocytes %: 71 % — ABNORMAL HIGH (ref 12–49)
Lymphocytes Absolute: 15 10*3/uL — ABNORMAL HIGH (ref 0.8–3.5)
MCH: 30.6 PG (ref 26.0–34.0)
MCHC: 34.2 g/dL (ref 30.0–36.5)
MCV: 89.6 FL (ref 80.0–99.0)
MPV: 11.5 FL (ref 8.9–12.9)
Monocytes %: 14 % — ABNORMAL HIGH (ref 5–13)
Monocytes Absolute: 3 10*3/uL — ABNORMAL HIGH (ref 0.0–1.0)
NRBC Absolute: 0 10*3/uL (ref 0.00–0.01)
Neutrophils %: 13 % — ABNORMAL LOW (ref 32–75)
Neutrophils Absolute: 3 10*3/uL (ref 1.8–8.0)
Nucleated RBCs: 0 PER 100 WBC
Other Cell: 1
Platelets: 120 10*3/uL — ABNORMAL LOW (ref 150–400)
RBC: 4.44 M/uL (ref 4.10–5.70)
RDW: 13.7 % (ref 11.5–14.5)
WBC: 21.1 10*3/uL — ABNORMAL HIGH (ref 4.1–11.1)

## 2018-09-25 LAB — BASIC METABOLIC PANEL
Anion Gap: 8 mmol/L (ref 5–15)
BUN: 13 MG/DL (ref 6–20)
Bun/Cre Ratio: 13 (ref 12–20)
CO2: 27 mmol/L (ref 21–32)
Calcium: 8.3 MG/DL — ABNORMAL LOW (ref 8.5–10.1)
Chloride: 100 mmol/L (ref 97–108)
Creatinine: 1.01 MG/DL (ref 0.70–1.30)
EGFR IF NonAfrican American: >60 mL/min/{1.73_m2} (ref >60)
GFR African American: >60 mL/min/{1.73_m2} (ref >60)
Glucose: 89 mg/dL (ref 65–100)
Potassium: 4.3 mmol/L (ref 3.5–5.1)
Sodium: 135 mmol/L — ABNORMAL LOW (ref 136–145)

## 2018-09-25 LAB — STREP AG SCREEN, GROUP A
Group A Strep Ag ID: NEGATIVE
Strep A Ag: NEGATIVE

## 2018-09-25 LAB — CBC WITH AUTOMATED DIFF
ABS. BASOPHILS: 0 10*3/uL (ref 0.0–0.1)
ABS. EOSINOPHILS: 0 10*3/uL (ref 0.0–0.4)
ABS. IMM. GRANS.: 0 10*3/uL (ref 0.00–0.04)
ABS. LYMPHOCYTES: 15 10*3/uL — ABNORMAL HIGH (ref 0.8–3.5)
ABS. MONOCYTES: 3 10*3/uL — ABNORMAL HIGH (ref 0.0–1.0)
ABS. NEUTROPHILS: 3 10*3/uL (ref 1.8–8.0)
ABSOLUTE NRBC: 0 10*3/uL (ref 0.00–0.01)
BAND NEUTROPHILS: 1 %
BASOPHILS: 0 % (ref 0–1)
EOSINOPHILS: 0 % (ref 0–7)
HCT: 39.8 % (ref 36.6–50.3)
HGB: 13.6 g/dL (ref 12.1–17.0)
IMMATURE GRANULOCYTES: 0 % (ref 0.0–0.5)
LYMPHOCYTES: 71 % — ABNORMAL HIGH (ref 12–49)
MCH: 30.6 PG (ref 26.0–34.0)
MCHC: 34.2 g/dL (ref 30.0–36.5)
MCV: 89.6 FL (ref 80.0–99.0)
MONOCYTES: 14 % — ABNORMAL HIGH (ref 5–13)
MPV: 11.5 FL (ref 8.9–12.9)
NEUTROPHILS: 13 % — ABNORMAL LOW (ref 32–75)
NRBC: 0 PER 100 WBC
OTHER CELL: 1
PLATELET: 120 10*3/uL — ABNORMAL LOW (ref 150–400)
RBC: 4.44 M/uL (ref 4.10–5.70)
RDW: 13.7 % (ref 11.5–14.5)
WBC: 21.1 10*3/uL — ABNORMAL HIGH (ref 4.1–11.1)

## 2018-09-25 LAB — METABOLIC PANEL, BASIC
Anion gap: 8 mmol/L (ref 5–15)
BUN/Creatinine ratio: 13 (ref 12–20)
BUN: 13 MG/DL (ref 6–20)
CO2: 27 mmol/L (ref 21–32)
Calcium: 8.3 MG/DL — ABNORMAL LOW (ref 8.5–10.1)
Chloride: 100 mmol/L (ref 97–108)
Creatinine: 1.01 MG/DL (ref 0.70–1.30)
GFR est AA: >60 mL/min/{1.73_m2} (ref >60)
GFR est non-AA: >60 mL/min/{1.73_m2} (ref >60)
Glucose: 89 mg/dL (ref 65–100)
Potassium: 4.3 mmol/L (ref 3.5–5.1)
Sodium: 135 mmol/L — ABNORMAL LOW (ref 136–145)

## 2018-09-25 MED ORDER — METHYLPREDNISOLONE (PF) 125 MG/2 ML IJ SOLR
125 mg/2 mL | Freq: Once | INTRAMUSCULAR | Status: AC
Start: 2018-09-25 — End: 2018-09-25
  Administered 2018-09-25: 18:00:00 via INTRAVENOUS

## 2018-09-25 MED ORDER — CEPHALEXIN 250 MG/5 ML ORAL SUSP
250 mg/5 mL | Freq: Three times a day (TID) | ORAL | 0 refills | Status: AC
Start: 2018-09-25 — End: 2018-10-03

## 2018-09-25 MED ORDER — CEFUROXIME AXETIL 250 MG/5 ML ORAL SUSP
250 mg/5 mL | Freq: Two times a day (BID) | ORAL | 0 refills | Status: DC
Start: 2018-09-25 — End: 2018-09-25

## 2018-09-25 MED ORDER — MORPHINE 4 MG/ML INTRAVENOUS SOLUTION
4 mg/mL | Freq: Once | INTRAVENOUS | Status: AC
Start: 2018-09-25 — End: 2018-09-25
  Administered 2018-09-25: 18:00:00 via INTRAVENOUS

## 2018-09-25 MED ORDER — SODIUM CHLORIDE 0.9 % IV PIGGY BACK
1 gram | Freq: Once | INTRAVENOUS | Status: AC
Start: 2018-09-25 — End: 2018-09-25
  Administered 2018-09-25: 19:00:00 via INTRAVENOUS

## 2018-09-25 MED ORDER — PREDNISONE 5 MG TABLETS IN A DOSE PACK
5 mg | ORAL_TABLET | ORAL | 0 refills | Status: AC
Start: 2018-09-25 — End: ?

## 2018-09-25 MED ORDER — LIDOCAINE 2 % MUCOSAL SOLN
2 % | Status: DC | PRN
Start: 2018-09-25 — End: 2018-09-25
  Administered 2018-09-25: 19:00:00 via OROMUCOSAL

## 2018-09-25 MED ORDER — LIDOCAINE 2 % MUCOSAL GEL
2 % | 0 refills | Status: AC
Start: 2018-09-25 — End: ?

## 2018-09-25 MED ORDER — HYDROCODONE 10 MG-ACETAMINOPHEN 325 MG/15 ML ORAL SOLUTION
10-325 mg/15 mL | Freq: Three times a day (TID) | ORAL | 0 refills | Status: DC | PRN
Start: 2018-09-25 — End: 2018-09-25

## 2018-09-25 MED ORDER — ACETAMINOPHEN-CODEINE 120 MG-12 MG/5 ML ELIXIR
120-12 mg/5 mL | Freq: Three times a day (TID) | ORAL | 0 refills | Status: AC | PRN
Start: 2018-09-25 — End: 2018-09-28

## 2018-09-25 MED ORDER — SODIUM CHLORIDE 0.9% BOLUS IV
0.9 % | INTRAVENOUS | Status: AC
Start: 2018-09-25 — End: 2018-09-25
  Administered 2018-09-25: 18:00:00 via INTRAVENOUS

## 2018-09-25 MED FILL — SODIUM CHLORIDE 0.9 % IV: INTRAVENOUS | Qty: 1000

## 2018-09-25 MED FILL — MORPHINE 4 MG/ML INTRAVENOUS SOLUTION: 4 mg/mL | INTRAVENOUS | Qty: 1

## 2018-09-25 MED FILL — SOLU-MEDROL (PF) 125 MG/2 ML SOLUTION FOR INJECTION: 125 mg/2 mL | INTRAMUSCULAR | Qty: 2

## 2018-09-25 MED FILL — LIDOCAINE VISCOUS 2 % MUCOSAL SOLUTION: 2 % | Qty: 15

## 2018-09-25 MED FILL — CEFTRIAXONE 1 GRAM SOLUTION FOR INJECTION: 1 gram | INTRAMUSCULAR | Qty: 1

## 2018-09-25 NOTE — ED Notes (Signed)
Radiology is in ED to obtain imaging of patient.

## 2018-09-25 NOTE — ED Notes (Signed)
Pt bed adjusted for comfort, sitting straight up. Pt reports he sometimes has difficulty with cramps in his abdomen and can't get comfortable. Pt continues to verbalize about the pain he is having in his throat. Provider aware.

## 2018-09-25 NOTE — ED Notes (Signed)
1225:  1st contact with this patient:  Patient identified.  Patient noted to be laying on stretcher in hospital gown.  Pt complains of sore throat with some nausea and diarrhea since Friday.  Pt is awake alert and oriented x 4 resp even and unlabored  NAD.  Pt educated on ER flow  Pt awaits provider eval    1337:  Portable xray noted to be at bedside

## 2018-09-25 NOTE — ED Notes (Signed)
Phlebotomy is in ED to obtain from specimens from patient.

## 2018-09-25 NOTE — ED Notes (Signed)
Sore throat with some vomiting and diarrhea. Feels sick. Not eating due to the sore throat. Symptoms started Friday. Also states feels SOB. +smoker

## 2018-09-25 NOTE — ED Provider Notes (Signed)
ED Provider Notes by Magda Bernheim, MD at 09/25/18 1317                Author: Magda Bernheim, MD  Service: EMERGENCY  Author Type: Physician       Filed: 09/26/18 0830  Date of Service: 09/25/18 1317  Status: Signed          Editor: Magda Bernheim, MD (Physician)               EMERGENCY DEPARTMENT HISTORY AND PHYSICAL EXAM               Date: 09/25/2018   Patient Name: Gregory [name]        History of Presenting Illness          Chief Complaint       Patient presents with        ?  Sore Throat           History Provided By: Patient      HPI: Gregory [name] is a 48 y.o. male, pmhx pericardial effusion, cardiac aneurysm,  coronary artery disease, who presents by car to the ED c/o sore throat, vomiting and diarrhea.  Patient states that his symptoms started 3 days ago, feels generally ill with initially very painful throat, difficulty swallowing and has been unable to eat  or drink for the past day.  Patient states that he has had some nausea with vomiting and has had several episodes of diarrhea daily.  Patient still smokes, and has some mild shortness of breath, occasional cough worse in the morning similar to his smoker's  cough.  Patient denies any ill contacts, denies any history of ENT surgery in the past.  Patient specifically denies any recent  abd pain, CP, urinary sxs, or headache.        PCP: None            There are no other complaints, changes, or physical findings at this time.               Past History        Past Medical History:     Past Medical History:        Diagnosis  Date         ?  CAD (coronary artery disease)            fluid around his heart         ?  Heart aneurysm       ?  Ill-defined condition            fluid around his heart/aortic aneurysm         ?  Irregular heart beat             Past Surgical History:     Past Surgical History:         Procedure  Laterality  Date          ?  HX CHOLECYSTECTOMY               Family History:   History reviewed. No pertinent family  history.      Social History:     Social History          Tobacco Use         ?  Smoking status:  Smoker, Current Status Unknown     ?  Smokeless tobacco:  Never Used       Substance Use Topics         ?  Alcohol use:  Not Currently         ?  Drug use:  Never        Social Hx: Currently smokes tobacco, no vaping, no EtOH      Allergies:     Allergies        Allergen  Reactions         ?  Nsaids (Non-Steroidal Anti-Inflammatory Drug)  Shortness of Breath                Review of Systems     Review of Systems    Constitutional: Positive for activity change, appetite change , chills and fatigue.  Negative for fever.    HENT: Positive for congestion, rhinorrhea , sore throat and trouble swallowing . Negative for ear pain and voice change.     Eyes: Negative for pain, discharge and redness.    Respiratory: Positive for cough and shortness of breath . Negative for chest tightness.          Chronic smoking cough, stable    Cardiovascular: Negative for chest pain and palpitations.    Gastrointestinal: Positive for diarrhea, nausea  and vomiting. Negative for abdominal pain.    Endocrine: Negative for polyuria.    Genitourinary: Negative for flank pain and hematuria.    Musculoskeletal: Negative for back pain and gait problem.    Skin: Negative for rash.    Allergic/Immunologic: Negative for immunocompromised state.    Neurological: Negative for syncope and headaches.    Psychiatric/Behavioral: Negative for behavioral problems and confusion.    All other systems reviewed and are negative.           Physical Exam     Physical Exam   Vitals signs and nursing note reviewed.   Constitutional:        Appearance: He is well-developed. He is ill-appearing.   HENT:       Head: Normocephalic.      Right Ear: Tympanic membrane normal.      Left Ear: Tympanic membrane normal.      Mouth/Throat:      Mouth: Mucous membranes are moist.      Pharynx:  Pharyngeal swelling and posterior oropharyngeal erythema present. No oropharyngeal   exudate or uvula swelling.      Tonsils: No tonsillar exudate or tonsillar abscesses. 1+ on the right.  1+ on the left.      Comments: Patient has deep diffuse erythema to both tonsillar pillars and tonsil fossa, uvula erythematous but  not significantly enlarged, no evidence of uvula or pillar deviation suggestive of PTA or RPA.  Neck :       Musculoskeletal: Normal range of motion.   Cardiovascular :       Rate and Rhythm: Normal rate and regular rhythm.   Pulmonary :       Effort: Pulmonary effort is normal.      Breath sounds: Normal breath sounds.   Abdominal :      Palpations: Abdomen is soft.      Tenderness: There is no abdominal tenderness.     Musculoskeletal: Normal range of motion.    Skin:      General: Skin is warm and dry.      Findings: No rash.    Neurological:       General: No focal deficit present.      Mental Status: He is alert and oriented to person, place, and time.    Psychiatric:  Mood and Affect: Mood normal.         Behavior: Behavior normal.               Diagnostic Study Results        Labs -         Recent Results (from the past 24 hour(s))     STREP AG SCREEN, GROUP A          Collection Time: 09/25/18 12:32 PM       Specimen: Serum; Throat         Result  Value  Ref Range            Group A Strep Ag ID  Negative  NEG         CBC WITH AUTOMATED DIFF          Collection Time: 09/25/18  1:39 PM         Result  Value  Ref Range            WBC  21.1 (H)  4.1 - 11.1 K/uL       RBC  4.44  4.10 - 5.70 M/uL       HGB  13.6  12.1 - 17.0 g/dL       HCT  16.139.8  09.636.6 - 50.3 %       MCV  89.6  80.0 - 99.0 FL       MCH  30.6  26.0 - 34.0 PG       MCHC  34.2  30.0 - 36.5 g/dL       RDW  04.513.7  40.911.5 - 14.5 %       PLATELET  120 (L)  150 - 400 K/uL       MPV  11.5  8.9 - 12.9 FL       NRBC  0.0  0 PER 100 WBC       ABSOLUTE NRBC  0.00  0.00 - 0.01 K/uL       NEUTROPHILS  13 (L)  32 - 75 %       BAND NEUTROPHILS  1  %       LYMPHOCYTES  71 (H)  12 - 49 %       MONOCYTES  14 (H)  5 - 13 %        EOSINOPHILS  0  0 - 7 %       BASOPHILS  0  0 - 1 %       OTHER CELL  1          IMMATURE GRANULOCYTES  0  0.0 - 0.5 %       ABS. NEUTROPHILS  3.0  1.8 - 8.0 K/UL       ABS. LYMPHOCYTES  15.0 (H)  0.8 - 3.5 K/UL       ABS. MONOCYTES  3.0 (H)  0.0 - 1.0 K/UL       ABS. EOSINOPHILS  0.0  0.0 - 0.4 K/UL       ABS. BASOPHILS  0.0  0.0 - 0.1 K/UL       ABS. IMM. GRANS.  0.0  0.00 - 0.04 K/UL       DF  MANUAL          RBC COMMENTS  NORMOCYTIC, NORMOCHROMIC          METABOLIC PANEL, BASIC          Collection Time: 09/25/18  1:39 PM  Result  Value  Ref Range            Sodium  135 (L)  136 - 145 mmol/L       Potassium  4.3  3.5 - 5.1 mmol/L       Chloride  100  97 - 108 mmol/L       CO2  27  21 - 32 mmol/L       Anion gap  8  5 - 15 mmol/L       Glucose  89  65 - 100 mg/dL       BUN  13  6 - 20 MG/DL       Creatinine  3.081.01  0.70 - 1.30 MG/DL       BUN/Creatinine ratio  13  12 - 20         GFR est AA  >60  >60 ml/min/1.673m2       GFR est non-AA  >60  >60 ml/min/1.873m2            Calcium  8.3 (L)  8.5 - 10.1 MG/DL           Radiologic Studies -      XR CHEST SNGL V       Final Result     IMPRESSION: No evidence of active lung disease.                 CT Results   (Last 48 hours)          None                 CXR Results   (Last 48 hours)                                    09/25/18 1351    XR CHEST SNGL V  Final result            Impression:    IMPRESSION: No evidence of active lung disease.                       Narrative:    Chest single view dated 09/25/2018             History is feel sick/shortness of breath             A single frontal view of the chest was obtained. The heart and lungs are normal      in appearance. There is no evidence of active intrathoracic disease.                                       Medical Decision Making     I am the first provider for this patient.      I reviewed the vital signs, available nursing notes, past medical history, past surgical history, family history and social history.       Vital Signs-Reviewed the patient's vital signs.   No data found.      Pulse Oximetry Analysis - 100% on RA     Cardiac Monitor:    Rate: 92 bpm   Rhythm: Normal Sinus Rhythm        Records Reviewed: Nursing Notes and Old Medical Records      Provider Notes (Medical Decision Making):    MDM: This  is a 48 year old gentleman who presents to the emergency department with an upper respiratory infection, shortness of breath, nausea, vomiting and diarrhea.  Appears to be generalized viral  infection, but has significant generalized erythema in the throat with no evidence of abscess but has significantly elevated white blood cell count and negative strep test.  Suspect that there is a bacterial component to the pharyngitis, and patient treated  with IV fluids, IV Rocephin, steroids and analgesia and discharged home on antibiotics.  Chest x-ray negative for pneumonia.  Patient instructed to return if shortness of breath worsens, unable to eat and is to follow-up with primary care.      ED Course:    Initial assessment performed. The patients presenting problems have been discussed, and they are in agreement with the care plan formulated and outlined with them.  I have encouraged them to ask questions as they arise throughout their visit.         PROGRESS NOTE:      Pt improved with IV fluids, steroids and feels better at this time.              Discharge note:      Pt re-evaluated and noted to be feeling better , ready for discharge. Updated pt  on all final lab and xray findings.  Will follow up as instructed . All questions have been answered, pt voiced understanding and agreement with plan.  Narcotics were prescribed,  pt was advised not to drive or operate heavy machinery. Abx were prescribed, pt advised that diarrhea and rash are possible side effects of the medications. Specific return precautions provided as well as instructions to return to the ED should sx worsen  at any time. Vital signs stable for discharge.                Diagnosis        Clinical Impression:       1.  Pharyngitis due to other organism            PLAN:   1.      Discharge Medication List as of 09/25/2018  4:01 PM              START taking these medications          Details        predniSONE (STERAPRED) 5 mg dose pack  See administration instruction per 5mg  dose pack, Normal, Disp-21 Tab,R-0               lidocaine (XYLOCAINE) 2 % jelly  Mix with 2 teaspoons of water, swish or gargle and spit out to soothe the throat., Normal, Disp-5 mL,R-0               cefUROXime (CEFTIN) 250 mg/5 mL suspension  Take 5 mL by mouth two (2) times a day., Normal, Disp-100 mL,R-0               HYDROcodone-Acetaminophen 10-325 mg/15 mL soln  Take 5 mL by mouth three (3) times daily as needed for Pain for up to 3 days. Max Daily Amount: 15 mL., Normal, Disp-45 mL,R-0                      2.      Follow-up Information               Follow up With  Specialties  Details  Why  Contact Info  [facility]    In 3 days  As needed, For reevaluation  [address]   [place] [id number]   [phone]              [facility]  Emergency Medicine  In 2 days  If symptoms worsen  [address]   [place] [id number]   [phone]             Return to ED if worse       Disposition:   Home            Please note, this dictation was completed with Dragon, the computer voice recognition software. Quite often unanticipated grammatical, syntax, homophones, and other interpretive errors  are inadvertently transcribed by the computer software. Please disregard these errors. Please excuse any errors that have escaped final proof reading.

## 2018-09-25 NOTE — ED Notes (Signed)
1225:  1st contact with this patient:  Patient identified.  Patient noted to be laying on stretcher in hospital gown.  Pt complains of sore throat with some nausea and diarrhea since Friday.  Pt is awake alert and oriented x 4 resp even and unlabored  NAD.  Pt educated on ER flow  Pt awaits provider eval    1337:  Portable xray noted to be at bedside

## 2018-09-25 NOTE — ED Notes (Signed)
Radiology is in ED to obtain imaging of patient.

## 2018-09-25 NOTE — ED Notes (Signed)
Pt bed adjusted for comfort, sitting straight up. Pt reports he sometimes has difficulty with cramps in his abdomen and can't get comfortable. Pt continues to verbalize about the pain he is having in his throat. Provider aware.

## 2018-09-25 NOTE — ED Triage Notes (Addendum)
Sore throat with some vomiting and diarrhea. Feels sick. Not eating due to the sore throat. Symptoms started Friday. Also states feels SOB. +smoker

## 2018-09-25 NOTE — ED Provider Notes (Signed)
EMERGENCY DEPARTMENT HISTORY AND PHYSICAL EXAM          Date: 09/25/2018  Patient Name: Gregory [name]    History of Presenting Illness     Chief Complaint   Patient presents with   ??? Sore Throat       History Provided By: Patient    HPI: Gregory [name] is a 48 y.o. male, pmhx pericardial effusion, cardiac aneurysm, coronary artery disease, who presents by car to the ED c/o sore throat, vomiting and diarrhea.  Patient states that his symptoms started 3 days ago, feels generally ill with initially very painful throat, difficulty swallowing and has been unable to eat or drink for the past day.  Patient states that he has had some nausea with vomiting and has had several episodes of diarrhea daily.  Patient still smokes, and has some mild shortness of breath, occasional cough worse in the morning similar to his smoker's cough.  Patient denies any ill contacts, denies any history of ENT surgery in the past.  Patient specifically denies any recent  abd pain, CP, urinary sxs, or headache.      PCP: None        There are no other complaints, changes, or physical findings at this time.         Past History     Past Medical History:  Past Medical History:   Diagnosis Date   ??? CAD (coronary artery disease)     fluid around his heart   ??? Heart aneurysm    ??? Ill-defined condition     fluid around his heart/aortic aneurysm   ??? Irregular heart beat        Past Surgical History:  Past Surgical History:   Procedure Laterality Date   ??? HX CHOLECYSTECTOMY         Family History:  History reviewed. No pertinent family history.    Social History:  Social History     Tobacco Use   ??? Smoking status: Smoker, Current Status Unknown   ??? Smokeless tobacco: Never Used   Substance Use Topics   ??? Alcohol use: Not Currently   ??? Drug use: Never     Social Hx: Currently smokes tobacco, no vaping, no EtOH    Allergies:  Allergies   Allergen Reactions   ??? Nsaids (Non-Steroidal Anti-Inflammatory Drug) Shortness of Breath          Review of Systems   Review of Systems   Constitutional: Positive for activity change, appetite change, chills and fatigue. Negative for fever.   HENT: Positive for congestion, rhinorrhea, sore throat and trouble swallowing. Negative for ear pain and voice change.    Eyes: Negative for pain, discharge and redness.   Respiratory: Positive for cough and shortness of breath. Negative for chest tightness.         Chronic smoking cough, stable   Cardiovascular: Negative for chest pain and palpitations.   Gastrointestinal: Positive for diarrhea, nausea and vomiting. Negative for abdominal pain.   Endocrine: Negative for polyuria.   Genitourinary: Negative for flank pain and hematuria.   Musculoskeletal: Negative for back pain and gait problem.   Skin: Negative for rash.   Allergic/Immunologic: Negative for immunocompromised state.   Neurological: Negative for syncope and headaches.   Psychiatric/Behavioral: Negative for behavioral problems and confusion.   All other systems reviewed and are negative.      Physical Exam   Physical Exam  Vitals signs and nursing note reviewed.   Constitutional:  Appearance: He is well-developed. He is ill-appearing.   HENT:      Head: Normocephalic.      Right Ear: Tympanic membrane normal.      Left Ear: Tympanic membrane normal.      Mouth/Throat:      Mouth: Mucous membranes are moist.      Pharynx: Pharyngeal swelling and posterior oropharyngeal erythema present. No oropharyngeal exudate or uvula swelling.      Tonsils: No tonsillar exudate or tonsillar abscesses. 1+ on the right. 1+ on the left.      Comments: Patient has deep diffuse erythema to both tonsillar pillars and tonsil fossa, uvula erythematous but not significantly enlarged, no evidence of uvula or pillar deviation suggestive of PTA or RPA.  Neck:      Musculoskeletal: Normal range of motion.   Cardiovascular:      Rate and Rhythm: Normal rate and regular rhythm.   Pulmonary:      Effort: Pulmonary effort is normal.       Breath sounds: Normal breath sounds.   Abdominal:      Palpations: Abdomen is soft.      Tenderness: There is no abdominal tenderness.   Musculoskeletal: Normal range of motion.   Skin:     General: Skin is warm and dry.      Findings: No rash.   Neurological:      General: No focal deficit present.      Mental Status: He is alert and oriented to person, place, and time.   Psychiatric:         Mood and Affect: Mood normal.         Behavior: Behavior normal.         Diagnostic Study Results     Labs -     Recent Results (from the past 24 hour(s))   STREP AG SCREEN, GROUP A    Collection Time: 09/25/18 12:32 PM    Specimen: Serum; Throat   Result Value Ref Range    Group A Strep Ag ID Negative NEG     CBC WITH AUTOMATED DIFF    Collection Time: 09/25/18  1:39 PM   Result Value Ref Range    WBC 21.1 (H) 4.1 - 11.1 K/uL    RBC 4.44 4.10 - 5.70 M/uL    HGB 13.6 12.1 - 17.0 g/dL    HCT 40.939.8 81.136.6 - 91.450.3 %    MCV 89.6 80.0 - 99.0 FL    MCH 30.6 26.0 - 34.0 PG    MCHC 34.2 30.0 - 36.5 g/dL    RDW 78.213.7 95.611.5 - 21.314.5 %    PLATELET 120 (L) 150 - 400 K/uL    MPV 11.5 8.9 - 12.9 FL    NRBC 0.0 0 PER 100 WBC    ABSOLUTE NRBC 0.00 0.00 - 0.01 K/uL    NEUTROPHILS 13 (L) 32 - 75 %    BAND NEUTROPHILS 1 %    LYMPHOCYTES 71 (H) 12 - 49 %    MONOCYTES 14 (H) 5 - 13 %    EOSINOPHILS 0 0 - 7 %    BASOPHILS 0 0 - 1 %    OTHER CELL 1      IMMATURE GRANULOCYTES 0 0.0 - 0.5 %    ABS. NEUTROPHILS 3.0 1.8 - 8.0 K/UL    ABS. LYMPHOCYTES 15.0 (H) 0.8 - 3.5 K/UL    ABS. MONOCYTES 3.0 (H) 0.0 - 1.0 K/UL    ABS. EOSINOPHILS 0.0 0.0 -  0.4 K/UL    ABS. BASOPHILS 0.0 0.0 - 0.1 K/UL    ABS. IMM. GRANS. 0.0 0.00 - 0.04 K/UL    DF MANUAL      RBC COMMENTS NORMOCYTIC, NORMOCHROMIC     METABOLIC PANEL, BASIC    Collection Time: 09/25/18  1:39 PM   Result Value Ref Range    Sodium 135 (L) 136 - 145 mmol/L    Potassium 4.3 3.5 - 5.1 mmol/L    Chloride 100 97 - 108 mmol/L    CO2 27 21 - 32 mmol/L    Anion gap 8 5 - 15 mmol/L    Glucose 89 65 - 100 mg/dL     BUN 13 6 - 20 MG/DL    Creatinine 8.291.01 5.620.70 - 1.30 MG/DL    BUN/Creatinine ratio 13 12 - 20      GFR est AA >60 >60 ml/min/1.3673m2    GFR est non-AA >60 >60 ml/min/1.6873m2    Calcium 8.3 (L) 8.5 - 10.1 MG/DL       Radiologic Studies -   XR CHEST SNGL V   Final Result   IMPRESSION: No evidence of active lung disease.        CT Results  (Last 48 hours)    None        CXR Results  (Last 48 hours)               09/25/18 1351  XR CHEST SNGL V Final result    Impression:  IMPRESSION: No evidence of active lung disease.       Narrative:  Chest single view dated 09/25/2018       History is feel sick/shortness of breath       A single frontal view of the chest was obtained. The heart and lungs are normal   in appearance. There is no evidence of active intrathoracic disease.                   Medical Decision Making   I am the first provider for this patient.    I reviewed the vital signs, available nursing notes, past medical history, past surgical history, family history and social history.    Vital Signs-Reviewed the patient's vital signs.  No data found.    Pulse Oximetry Analysis - 100% on RA    Cardiac Monitor:   Rate: 92 bpm  Rhythm: Normal Sinus Rhythm      Records Reviewed: Nursing Notes and Old Medical Records    Provider Notes (Medical Decision Making):   MDM: This is a 48 year old gentleman who presents to the emergency department with an upper respiratory infection, shortness of breath, nausea, vomiting and diarrhea.  Appears to be generalized viral infection, but has significant generalized erythema in the throat with no evidence of abscess but has significantly elevated white blood cell count and negative strep test.  Suspect that there is a bacterial component to the pharyngitis, and patient treated with IV fluids, IV Rocephin, steroids and analgesia and discharged home on antibiotics.  Chest x-ray negative for pneumonia.  Patient instructed to return if shortness of breath worsens,  unable to eat and is to follow-up with primary care.    ED Course:   Initial assessment performed. The patients presenting problems have been discussed, and they are in agreement with the care plan formulated and outlined with them.  I have encouraged them to ask questions as they arise throughout their visit.      PROGRESS NOTE:  Pt improved with IV fluids, steroids and feels better at this time.          Discharge note:    Pt re-evaluated and noted to be feeling better , ready for discharge. Updated pt  on all final lab and xray findings.  Will follow up as instructed . All questions have been answered, pt voiced understanding and agreement with plan.  Narcotics were prescribed, pt was advised not to drive or operate heavy machinery. Abx were prescribed, pt advised that diarrhea and rash are possible side effects of the medications. Specific return precautions provided as well as instructions to return to the ED should sx worsen at any time. Vital signs stable for discharge.         Diagnosis     Clinical Impression:   1. Pharyngitis due to other organism        PLAN:  1.   Discharge Medication List as of 09/25/2018  4:01 PM      START taking these medications    Details   predniSONE (STERAPRED) 5 mg dose pack See administration instruction per 5mg  dose pack, Normal, Disp-21 Tab,R-0      lidocaine (XYLOCAINE) 2 % jelly Mix with 2 teaspoons of water, swish or gargle and spit out to soothe the throat., Normal, Disp-5 mL,R-0      cefUROXime (CEFTIN) 250 mg/5 mL suspension Take 5 mL by mouth two (2) times a day., Normal, Disp-100 mL,R-0      HYDROcodone-Acetaminophen 10-325 mg/15 mL soln Take 5 mL by mouth three (3) times daily as needed for Pain for up to 3 days. Max Daily Amount: 15 mL., Normal, Disp-45 mL,R-0           2.   Follow-up Information     Follow up With Specialties Details Why Contact Info    [facility]  In 3 days As needed, For reevaluation [address]   [place] [id number]  [phone]    [facility] Emergency Medicine In 2 days If symptoms worsen [address]  [place] [id number]  [phone]        Return to ED if worse     Disposition:  Home       Please note, this dictation was completed with Dragon, the computer voice recognition software. Quite often unanticipated grammatical, syntax, homophones, and other interpretive errors are inadvertently transcribed by the computer software. Please disregard these errors. Please excuse any errors that have escaped final proof reading.

## 2018-09-26 NOTE — Progress Notes (Signed)
Outgoing calls placed regarding recent visit to [facility]. No answer message via voicemail. No response from patient, episode of care closed.

## 2018-09-26 NOTE — Progress Notes (Signed)
Outgoing calls placed regarding recent visit to [facility]. No answer message via voicemail. No response from patient, episode of care closed.

## 2018-09-27 LAB — CULTURE, THROAT
Culture result:: NORMAL
Culture: NORMAL

## 2018-11-08 ENCOUNTER — Emergency Department
Admission: EM | Admit: 2018-11-08 | Discharge: 2018-11-08 | Disposition: A | Discharge status: Home / Self Care | Payer: Self-pay | Attending: Family | Admitting: Family

## 2018-11-08 ENCOUNTER — Emergency Department: Payer: Self-pay

## 2018-11-08 DIAGNOSIS — M79604 Pain in right leg: Secondary | ICD-10-CM | POA: Insufficient documentation

## 2018-11-08 DIAGNOSIS — M5431 Sciatica, right side: Secondary | ICD-10-CM | POA: Insufficient documentation

## 2018-11-08 HISTORY — DX: Pneumonia, unspecified organism: J18.9

## 2018-11-08 LAB — CBC AND DIFFERENTIAL
Basophils %: 1.2 % (ref 0.0–3.0)
Basophils Absolute: 0.1 10*3/uL (ref 0.0–0.3)
Eosinophils %: 3.6 % (ref 0.0–7.0)
Eosinophils Absolute: 0.2 10*3/uL (ref 0.0–0.8)
Hematocrit: 37.5 % — ABNORMAL LOW (ref 39.0–52.5)
Hemoglobin: 12.3 gm/dL — ABNORMAL LOW (ref 13.0–17.5)
Lymphocytes Absolute: 2.9 10*3/uL (ref 0.6–5.1)
Lymphocytes: 44.4 % (ref 15.0–46.0)
MCH: 29 pg (ref 28–35)
MCHC: 33 gm/dL (ref 31–36)
MCV: 88 fL (ref 80–100)
MPV: 8.6 fL (ref 6.0–10.0)
Monocytes Absolute: 0.4 10*3/uL (ref 0.1–1.7)
Monocytes: 5.9 % (ref 3.0–15.0)
Neutrophils %: 44.9 % (ref 42.0–78.0)
Neutrophils Absolute: 3 10*3/uL (ref 1.7–8.6)
PLT CT: 188 10*3/uL (ref 130–440)
RBC: 4.26 10*6/uL (ref 4.00–5.70)
RDW: 14 % (ref 10.5–14.5)
WBC: 6.6 10*3/uL (ref 4.0–11.0)

## 2018-11-08 LAB — COMPREHENSIVE METABOLIC PANEL
ALT: 33 U/L (ref 0–55)
AST (SGOT): 16 U/L (ref 10–42)
Albumin/Globulin Ratio: 1 Ratio (ref 0.80–2.00)
Albumin: 3.4 gm/dL — ABNORMAL LOW (ref 3.5–5.0)
Alkaline Phosphatase: 85 U/L (ref 40–145)
Anion Gap: 14.1 mMol/L (ref 7.0–18.0)
BUN / Creatinine Ratio: 22.1 Ratio (ref 10.0–30.0)
BUN: 21 mg/dL (ref 7–22)
Bilirubin, Total: 0.2 mg/dL (ref 0.1–1.2)
CO2: 22.5 mMol/L (ref 20.0–30.0)
Calcium: 8.7 mg/dL (ref 8.5–10.5)
Chloride: 107 mMol/L (ref 98–110)
Creatinine: 0.95 mg/dL (ref 0.80–1.30)
EGFR: 96 mL/min/{1.73_m2} (ref 60–150)
Globulin: 3.4 gm/dL (ref 2.0–4.0)
Glucose: 122 mg/dL — ABNORMAL HIGH (ref 71–99)
Osmolality Calculated: 284 mOsm/kg (ref 275–300)
Potassium: 3.6 mMol/L (ref 3.5–5.3)
Protein, Total: 6.8 gm/dL (ref 6.0–8.3)
Sodium: 140 mMol/L (ref 136–147)

## 2018-11-08 MED ORDER — OXYCODONE-ACETAMINOPHEN 5-325 MG PO TABS
ORAL_TABLET | ORAL | Status: AC
Start: 2018-11-08 — End: ?
  Filled 2018-11-08: qty 1

## 2018-11-08 MED ORDER — OXYCODONE-ACETAMINOPHEN 5-325 MG PO TABS
1.00 | ORAL_TABLET | Freq: Four times a day (QID) | ORAL | 0 refills | Status: AC | PRN
Start: 2018-11-08 — End: 2018-11-15

## 2018-11-08 MED ORDER — OXYCODONE-ACETAMINOPHEN 5-325 MG PO TABS
1.00 | ORAL_TABLET | Freq: Once | ORAL | Status: AC
Start: 2018-11-08 — End: 2018-11-08
  Administered 2018-11-08: 15:00:00 1 via ORAL

## 2018-11-08 NOTE — EDIE (Signed)
COLLECTIVE?NOTIFICATION?11/08/2018 12:15?Yip, Hawk?MRN: [id number]    [facility]'s patient encounter information:   VWU:?[id number]  Account [id number]  Billing Account [id number]      Criteria Met      3+Facilities in 90 Days    High Utilization (6+ ED Visits/6 Mo.)    Security and Safety  No recent Security Events currently on file    ED Care Guidelines  There are currently no ED Care Guidelines for this patient. Please check your facility's medical records system.    Flags      Negative COVID-19 Lab Result - [facility] - A specimen collected from this patient was negative for COVID-19 / Attributed By: [place] Department of [employer] / Attributed On: 10/12/2018       History of Sepsis - Patient has received a diagnosis of Sepsis from an acute or post-acute setting. Apply appropriate clinical planning practices; to learn more visit [email or web address].[email or web address] / Attributed By: [facility] / Attributed On: 11/08/2018       Prescription Monitoring Program  200??- Narcotic Use Score  130??- Sedative Use Score  000??- Stimulant Use Score  440??- Overdose Risk Score  - All Scores range from 000-999 with 75% of the population scoring < 200 and on 1% scoring above 650  - The last digit of the narcotic, sedative, and stimulant score indicates the number of active prescriptions of that type  - Higher Use scores correlate with increased prescribers, pharmacies, mg equiv, and overlapping prescriptions  - Higher Overdose Risk Scores correlate with increased risk of unintentional overdose death   Concerning or unexpectedly high scores should prompt a review of the PMP record; this does not constitute checking PMP for prescribing purposes.      E.D. Visit Count (12 mo.)  Facility Visits   [facility] 1   [facility] ([facility] 2   [facility] 1   [facility] 1   [facility] [address] 1   [facility] 1   [facility] 1   [facility] 1   Total 9   Note: Visits indicate total known visits.      Recent Emergency Department Visit Summary  Date Facility [facility] Type Diagnoses or Chief Complaint   Nov 08, 2018 [facility] [place] [place] [facility]      RIGTH LEG NUMB      Oct 22, 2018 [facility] [place] [place] [facility]      SOB/EMS      SHORTNESS OF BREATH FEVER 9 WEEKS TO 74 YEARS      SHORTNESS OF BREATH FEVER 9 WEEKS TO 7      Acute respiratory failure with hypoxia      Oct 11, 2018 [facility] [address] [place]. [place] [facility]      Difficulty Breathing      SHORTNESS OF BREATH      Unspecified atrial fibrillation      Sep 26, 2018 [facility] [place] [facility] [place] [place] [facility]      Swollen throat      Infectious mononucleosis, unspecified without complication      Acute tonsillitis, unspecified      Sep 25, 2018 [facility] [address] [place] [place] [facility]      Acute pharyngitis due to other specified organisms      Sep 18, 2018 [facility] [place] [facility] [place] [place] [facility]      Right side back pain      Unspecified abdominal pain      Aug 13, 2018 [facility] [place] [facility]      Shortness of breath      Bronchitis, not specified as acute or chronic      Other general symptoms and signs      Apr 28, 2018 [facility] [place] [facility]      Chest pain, unspecified      Nov 07, 2017 [facility] [place] [place] [place] [facility]      Cutaneous abscess, unspecified          Recent Inpatient Visit Summary  Date Facility [facility] Type Diagnoses or Chief Complaint   Oct 22, 2018 [facility] [place] [place] [facility]      RESPIRATORY FAILURE HCC      Difficulty in walking, not elsewhere classified      SHORTNESS OF BREATH FEVER 9 WEEKS TO 74 YEARS      2. Acute respiratory failure with hypoxia       2. Unspecified bacterial pneumonia      2. Sepsis, unspecified organism      3. Pneumonia, unspecified organism      4. Body mass index (BMI) 40.0-44.9, adult      5. Chronic obstructive pulmonary disease with acute lower respir      5. Severe sepsis with septic shock      Oct 11, 2018 [facility] [place] [facility]      SHORTNESS OF BREATH      1. Unspecified atrial fibrillation      2. Paroxysmal atrial fibrillation      3. Pneumonia, unspecified organism      4. Body mass index (BMI) 40.0-44.9, adult      5. Chronic obstructive pulmonary disease with (acute) exacerbati      6. Chronic obstructive pulmonary disease with acute lower respir      7. Supraventricular tachycardia      8. Contact with and (suspected) exposure to other viral communic      9. Encounter for immunization          Care Team  There is not a care team on record at this time.   Collect[facility] Portal  This patient has registered at the [facility] Emergency Department   For more information visit: [email or web address]     PLEASE NOTE:     1.   Any care recommendations and other clinical information are provided as guidelines or for historical purposes only, and providers should exercise their own clinical judgment when providing care.    2.   You may only use this information for purposes of treatment, payment or health care operations activities, and subject to the limitations of applicable [employer] Policies.    3.   You should consult directly with the organization that provided a care guideline or other clinical history with any questions about additional information or accuracy or completeness of information provided.    ? 2020 [place], [employer] - [employer]

## 2018-11-08 NOTE — ED Provider Notes (Signed)
[facility] EMERGENCY DEPARTMENT   History and Physical Exam      Patient Name: Michael Williamson, Michael Williamson  Encounter Date:  11/08/2018  Attending Physician: Justice Britain, *    Nurse Practitioner: Jens Som, NP  PCP: Marisa Sprinkles, MD  Patient DOB:  1970-12-25  MRN:  [id number]  Room:  E3/ED3-A      History of Presenting Illness     Chief complaint: Leg Pain    HPI/ROS is limited by: none  HPI/ROS given by: patient      Provocative: ambulation  Pallative Factors: none  Quality: numbness in right anterior thigh, throbbing in lower part of leg-calf  Region: right leg, as noted  Radiation: down leg  Severity: moderate  Duration/Temporal Factors: started yesterday.       Marquis Sobotka is a 48 y.o. male who presents with pain in his right leg.  His right anterior thigh is numb.  His right calf is throbbing. He has no discoloration. He believes his right leg is swelling.  He has low back pain but he always has some low back pain. He most recently was hospitalized and intubated due to pneumonia. He states his COVID tests were all negative. He is on eliquis.  He denies fever, chest pain or shortness of breath.        Review of Systems   Review of Systems   Constitutional: Negative for chills and fever.   HENT: Negative for sore throat.    Eyes: Negative for discharge and redness.   Respiratory: Negative for cough and shortness of breath.    Cardiovascular: Positive for leg swelling. Negative for chest pain.   Gastrointestinal: Negative for nausea and vomiting.   Genitourinary: Negative for hematuria.   Musculoskeletal: Positive for back pain.   Skin: Negative for rash.   Neurological: Positive for sensory change.   Psychiatric/Behavioral: Negative for suicidal ideas.       Allergies     Pt is allergic to nsaids and toradol [ketorolac tromethamine].    Medications     Current Outpatient Medications   Medication Sig   . amiodarone (PACERONE) 400 MG tablet Take 200 mg by mouth   . apixaban (Eliquis) 5 MG Take 5 mg by mouth every 12  (twelve) hours   . nadolol (CORGARD) 20 MG tablet Take 20 mg by mouth   . oxyCODONE-acetaminophen (PERCOCET) 5-325 MG per tablet Take 1 tablet by mouth every 6 (six) hours as needed for Pain       Past Medical History     Pt has a past medical history of Pneumonia.    Past Surgical History     Pt has a past surgical history that includes Cholecystectomy.    Family History     The family history is not on file.    Social History     Pt reports that he has quit smoking. He has never used smokeless tobacco. He reports previous alcohol use. He reports that he does not use drugs.    Physical Exam     Blood pressure 121/76, pulse 65, temperature 98.5 F (36.9 C), temperature source Oral, resp. rate 16, height 1.753 m, weight 124.7 kg, SpO2 95 %.    Physical Exam   Constitutional: He is oriented to person, place, and time. He appears well-developed and well-nourished. No distress.   HENT:   Head: Normocephalic.   Mouth/Throat: Oropharynx is clear and moist.   Eyes: Pupils are equal, round, and reactive to light. Conjunctivae and EOM are normal.  Neck: Normal range of motion. Neck supple.   Cardiovascular: Normal rate and regular rhythm.   Pulmonary/Chest: Effort normal and breath sounds normal.   Musculoskeletal:      Right hip: Normal.      Right knee: He exhibits normal range of motion and no swelling. No tenderness found.      Lumbar back: He exhibits normal range of motion, no tenderness, no swelling, no deformity, no spasm and normal pulse.      Right lower leg: He exhibits tenderness. He exhibits no bony tenderness and no deformity. Edema (nonpitting ) present.      Left lower leg: He exhibits no tenderness, no bony tenderness, no deformity and no laceration. Edema (non-pitting) present.   Neurological: He is alert and oriented to person, place, and time.   Skin: Skin is warm and dry.   Psychiatric: He has a normal mood and affect. His behavior is normal.   Nursing note and vitals reviewed.      Orders Placed      Orders Placed This Encounter   Procedures   . CT Lumbar Spine WO Contrast   . US Venous Low Extrem Duplx Dopp Uni Right   . CBC and differential   . Comprehensive metabolic panel   . Saline lock IV       Diagnostic Results       The results of the diagnostic studies below have been reviewed by myself:    Labs  Results     Procedure Component Value Units Date/Time    Comprehensive metabolic panel [[id number]]  (Abnormal) Collected: 11/08/18 1319    Specimen: Plasma Updated: 11/08/18 1351     Sodium 140 mMol/L      Potassium 3.6 mMol/L      Chloride 107 mMol/L      CO2 22.5 mMol/L      Calcium 8.7 mg/dL      Glucose 409 mg/dL      Creatinine 8.11 mg/dL      BUN 21 mg/dL      Protein, Total 6.8 gm/dL      Albumin 3.4 gm/dL      Alkaline Phosphatase 85 U/L      ALT 33 U/L      AST (SGOT) 16 U/L      Bilirubin, Total 0.2 mg/dL      Albumin/Globulin Ratio 1.00 Ratio      Anion Gap 14.1 mMol/L      BUN / Creatinine Ratio 22.1 Ratio      EGFR 96 mL/min/1.17m2      Osmolality Calculated 284 mOsm/kg      Globulin 3.4 gm/dL     CBC and differential [[id number]]  (Abnormal) Collected: 11/08/18 1319    Specimen: Blood Updated: 11/08/18 1335     WBC 6.6 K/cmm      RBC 4.26 M/cmm      Hemoglobin 12.3 gm/dL      Hematocrit 21.3 %      MCV 88 fL      MCH 29 pg      MCHC 33 gm/dL      RDW 08.6 %      PLT CT 188 K/cmm      MPV 8.6 fL      Neutrophils % 44.9 %      Lymphocytes 44.4 %      Monocytes 5.9 %      Eosinophils % 3.6 %      Basophils % 1.2 %  Neutrophils Absolute 3.0 K/cmm      Lymphocytes Absolute 2.9 K/cmm      Monocytes Absolute 0.4 K/cmm      Eosinophils Absolute 0.2 K/cmm      Basophils Absolute 0.1 K/cmm           Radiologic Studies  Radiology Results (24 Hour)     Procedure Component Value Units Date/Time    CT Lumbar Spine WO Contrast [[id number]] Collected: 11/08/18 1424    Order Status: Completed Updated: 11/08/18 1430    Narrative:      Clinical History:  right thigh numbness    Ordering Comments:   None.       Study Notes:   Right leg numbness increasing the past couple days. No injury     Examination:  CT lumbar spine without intravenous contrast Multiplanar reconstructions obtained.    CT images were acquired utilizing Automated Exposure Control for dose reduction.     Comparison:  None available.    Findings:  CT examination of the lumbar spine was performed in the axial plane beginning at the bottom of T11 and extending to the bottom of S2. Sagittal and coronal reconstruction was performed. The examination demonstrates normal alignment of lumbar vertebrae.   There is preservation vertebral body height and disc space height at all levels. 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 No fracture, spondylolisthesis, or spondylolysis is identified. No disc herniation or spinal   stenosis is identified. No bony foraminal narrowing is seen. There is mild focal central bulging of the L4-L5 disc. No paraspinous mass is identified. There is moderate degenerative change facets bilaterally at the L5-S1 level.      Impression:      There is normal alignment of lumbar vertebrae. There is preservation vertebral body height and disc space height at all levels. No fracture, spondylolisthesis, or spondylolysis is identified. 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 There is mild focal central bulging of the L4-L5 disc. No disc herniation or spinal stenosis identified. No foraminal narrowing is seen. There is moderate degenerative change of the facets bilaterally at the L5-S1 level. No   paraspinous mass is identified.    ReadingStation:[email or web address]    US Venous Low Extrem Duplx Dopp Uni Right [[id number]] Collected: 11/08/18 1410    Order Status: Completed Updated: 11/08/18 1412    Narrative:      Clinical History:  Leg DVT suspected  eval for DVT    Examination:  US VENOUS LOW EXTREM DUPLX DOPP UNI RIGHT    COMPARISON:  None    TECHNIQUE:   Grayscale, color,  duplex Doppler sonography right leg    FINDINGS:  Right common femoral, femoral, and popliteal veins are compressible. Antegrade flow was observed on color Doppler. Augmentation was observed. No internal echoes detected. Color Doppler showed antegrade flow in the calf veins.    Left common femoral vein is compressible, has antegrade flow on color Doppler and demonstrated augmentation.      Impression:      No DVT in the right lower extremity.    ReadingStation:[id number]          EKG:   Last EKG Result     None            Procedures     Chart review:   "LYCAN DAVEE is a 48 y.o. male with a history of recent diagnosis of atrial fibrillation and  pneumonia with COPD is seen in hospital follow-up. He was originally hospitalized from the ninth 17 March [place]. During his hospitalization at [facility] he tested negative for coronavirus twice. He was seen by the cardiologist and discharged on a beta-blocker, amiodarone and Eliquis. He was referred to a sleep study for suspicion for sleep apnea. Patient states that unfortunately he remained short of breath upon discharge from the hospital with coughing up brownish colored phlegm. Yesterday he obtained a pulse oximeter and his saturation was in the 60s. Emergency department his chest x-ray confirmed patchy basilar infiltrates he remained afebrile. He was treated with cefepime vancomycin and Lasix.    He was diagnosed with acute respiratory failure secondary to multifocal pneumonia likely bacterial origin. Required broad-spectrum antibiotic's and was initially intubated during his admission and eventually weaned to room air. Was deemed that he did not require oxygen on it at discharge. He was asked to complete a 14-day course of doxycycline he had extensive pulmonary work-up with no specific diagnosis found. He was subsequently treated for bacterial pneumonia as a diagnosis of exclusion."      PMP reviewed.   ED Course & MDM / Critical Care     Blood pressure  121/76, pulse 65, temperature 98.5 F (36.9 C), temperature source Oral, resp. rate 16, height 1.753 m, weight 124.7 kg, SpO2 95 %.    I reviewed the vital signs, nursing notes, past medical history, past surgical history, family history and social history.   I have reviewed the patient's previous charts.     O2 sat- saturation: 95% ; Oxygen use: RA Interpretation: Normal         D/Dx: DVT, sciatica, cellulitis, among others    This patient presents to the Emergency Department with RLE pain and right anterior thigh numbness. Doppler was negative for DVT. Lumbar xray was unremarkable. Patient's pain was controlled. It is felt he can be safely discharged home. The diagnostic impression and plan and appropriate follow-up were discussed and agreed upon with the patient.  Results of lab/radiology tests were reviewed and discussed. I did encourage him to return for any change or worsening of symptoms. PMP Reviewed. Safety of medication reviewed.   All questions were answered and concerns addressed.  Leg pain/sciatica precautions have been given and the patient was warned to return immediately for worsening symptoms or any acute concerns and to follow up with PCP within an appropriate time as discussed.       Patient verbalized understanding and was agreeable to plan.     I discussed this case with the attending physician in the emergency department and they agree with the assessment and treatment plan.     Medications Given in ED:  E3-ED3-A - MAR ACTION REPORT  (last 24 hrs)         ** No medications to display **          Prescriptions:  Discharge Medication List as of 11/08/2018  3:47 PM      START taking these medications    Details   oxyCODONE-acetaminophen (PERCOCET) 5-325 MG per tablet Take 1 tablet by mouth every 6 (six) hours as needed for Pain, Starting Wed 11/08/2018, Until Wed 11/15/2018, E-Rx               Follow-up Information     Schedule an appointment as soon as possible for a visit with Your doctor.  Diagnosis / Disposition     Clinical Impression  1. Sciatica of right side    2. Pain of right lower extremity        Disposition  ED Disposition     ED Disposition Condition Date/Time Comment    Discharge  Wed Nov 08, 2018  3:47 PM Emmaline Life discharge to home/self care.    Condition at disposition: Stable          Jens Som, NP  1:09 PM      This chart was generated by an EMR and may contain errors, including typographical, or omissions not intended by the user.        Valora Piccolo A, NP  11/09/18 1653

## 2018-11-08 NOTE — Discharge Instructions (Signed)
Understanding Lumbar Radiculopathy    Lumbar radiculopathy is irritation or inflammation of a nerve root in the low back. It causes symptoms that spread out from the back down one or both legs. To understand this condition, it helps to understand the parts of the spine:   Vertebrae. These are bones that stack to form the spine. The lumbar spine contains 5 vertebrae near the bottom of your spine.   Disks. These are soft pads of tissue between the vertebrae. They act as shock absorbers for the spine.   Spinal canal. This is a tunnel formed within the stacked vertebrae. In the lumbar spine, nerves run through this canal.   Nerves. These branch off and leave the spinal canal, traveling out to parts of the body. As they leave the spinal canal, nerves pass through openings between the vertebrae. The nerve root is the part of the nerve that is closest to the spinal canal.   Sciatic nerve. This is a large nerve formed from several nerve roots in the low back. This nerve extends down the back of the leg to the foot.  With lumbar radiculopathy, nerve roots in the low back become irritated. This leads to pain and symptoms. The sciatic nerve is commonly involved, so the condition is often called sciatica.  What causes lumbar radiculopathy?  Aging, injury, poor posture, extra body weight, and other issues can lead to problems in the low back. These problems may then irritate nerve roots. They include:   Damage to a disk in the lumbar spine. The damaged disk may then press on nearby nerve roots.   Degeneration from wear and tear, and aging. This can lead to narrowing (stenosis) of the openings between the vertebrae. The narrowed openings press on nerve roots as they leave the spinal canal.   Unstable spine. This is when a vertebra slips forward. It can then press on a nerve root.  Other, less common things can put pressure on nerves in the low back. These include diabetes, infection, or a tumor.  Symptoms of lumbar  radiculopathy  These include:   Pain in the low back   Pain, numbness, tingling, or weakness that travels into the buttocks, hip, groin, or leg   Muscle spasms in the low back, or leg  Treatment for lumbar radiculopathy  In most cases, your healthcare provider will first try treatments that help relieve symptoms. These may include:   Prescription and over-the-counter pain medicines. These help relieve pain, swelling, and irritation.   Limits on positions and activities that increase pain. But lying in bed or avoiding all movement is only recommended for a short period of time.   Physical therapy, including exercises and stretches. This helps decrease pain and increase movement and function.   Steroid shots into the lower back. This may help relieve symptoms for a time.   Weight-loss program. If you are overweight, losing extra pounds (kilograms)may help relieve symptoms.  In some cases, you may need surgery to fix the underlying problem. This depends on the cause, the symptoms, and how long the pain has lasted.  Possible complications  Over time, an irritated and inflamed nerve may become damaged. This may lead to long-lasting (permanent) numbness or weakness in your legs and feet. If symptoms change suddenly or get worse, be sure to let your healthcare provider know.  When to call your healthcare provider  Call your healthcare provider right away if you have any of these:   New pain or pain that gets   worse   New or increasing weakness, tingling, or numbness in your leg or foot   Problems controlling your bladder or bowel  [employer] last reviewed this educational content on 03/04/2018   2000-2020 [employer] [address], [place] [address]. All rights reserved. This information is not intended as a substitute for professional medical care. Always follow your healthcare professional's instructions.

## 2018-11-08 NOTE — ED Triage Notes (Signed)
Chief Complaint   Patient presents with   . Leg Pain     Past Medical History:   Diagnosis Date   . Pneumonia      BP 102/67   Pulse 70   Temp 98.5 F (36.9 C) (Oral)   Resp 18   Ht 1.753 m   Wt 124.7 kg   SpO2 96%   BMI 40.61 kg/m

## 2018-11-08 NOTE — ED Notes (Signed)
Patient having bedside radiology for [place] right leg.

## 2019-01-31 ENCOUNTER — Emergency Department
Admission: EM | Admit: 2019-01-31 | Discharge: 2019-01-31 | Disposition: A | Discharge status: Home / Self Care | Payer: Self-pay | Attending: Emergency Medicine | Admitting: Emergency Medicine

## 2019-01-31 DIAGNOSIS — K047 Periapical abscess without sinus: Secondary | ICD-10-CM | POA: Insufficient documentation

## 2019-01-31 DIAGNOSIS — K0889 Other specified disorders of teeth and supporting structures: Secondary | ICD-10-CM | POA: Insufficient documentation

## 2019-01-31 MED ORDER — AMOXICILLIN 500 MG PO CAPS
500.00 mg | ORAL_CAPSULE | Freq: Three times a day (TID) | ORAL | 0 refills | Status: DC
Start: 2019-01-31 — End: 2019-01-31

## 2019-01-31 MED ORDER — METHYLPREDNISOLONE 4 MG PO TBPK
ORAL_TABLET | ORAL | 0 refills | Status: DC
Start: 2019-01-31 — End: 2019-01-31

## 2019-01-31 MED ORDER — METHYLPREDNISOLONE 4 MG PO TBPK
ORAL_TABLET | ORAL | 0 refills | Status: AC
Start: 2019-01-31 — End: ?

## 2019-01-31 MED ORDER — AMOXICILLIN 500 MG PO CAPS
500.00 mg | ORAL_CAPSULE | Freq: Three times a day (TID) | ORAL | 0 refills | Status: AC
Start: 2019-01-31 — End: 2019-02-10

## 2019-01-31 NOTE — ED Triage Notes (Signed)
Patient comes to the ED for right upper dental pain for the past three days. Taking tylenol without relief. Denies fevers.

## 2019-01-31 NOTE — ED Provider Notes (Signed)
History     Chief Complaint   Patient presents with   . Dental Pain     HPI     Patient is a pleasant 48 year old man with a history of atrial fibrillation for which he takes amiodarone and Eliquis.  He was previously intubated in the ICU for uncontrolled A. fib.  He is here today with complaints of dental pain.    Past Medical History:   Diagnosis Date   . Pneumonia        Past Surgical History:   Procedure Laterality Date   . CHOLECYSTECTOMY         History reviewed. No pertinent family history.    Social  Social History     Tobacco Use   . Smoking status: Former Games developer   . Smokeless tobacco: Never Used   Substance Use Topics   . Alcohol use: Not Currently   . Drug use: Never       .     Allergies   Allergen Reactions   . Nsaids    . Toradol [Ketorolac Tromethamine]        Home Medications     Med List Status: In Progress Set By: Princess Perna, RN at 01/31/2019  6:06 AM                amiodarone (PACERONE) 400 MG tablet     Take 200 mg by mouth     apixaban (Eliquis) 5 MG     Take 5 mg by mouth every 12 (twelve) hours     nadolol (CORGARD) 20 MG tablet     Take 20 mg by mouth           Review of Systems     A complete and comprehensive review of systems has been conducted and is significant for dental pain as noted per HPI.   No other systems reported positive    Physical Exam    BP: (!) 138/93, Heart Rate: 81, Temp: 97.9 F (36.6 C), Resp Rate: 20, SpO2: 98 %, Weight: 141.2 kg    Physical Exam  Constitutional:       Appearance: Normal appearance. He is obese.   HENT:      Head: Normocephalic and atraumatic.      Nose: Nose normal.      Mouth/Throat:      Mouth: Mucous membranes are moist.      Comments: Patient has gingival hypertrophy in the vicinity of his right upper molars.  Poor dentition.  Neck:      Musculoskeletal: Normal range of motion and neck supple.   Musculoskeletal: Normal range of motion.         General: No deformity or signs of injury.   Skin:     General: Skin is warm and dry.    Neurological:      Mental Status: He is alert and oriented to person, place, and time. Mental status is at baseline.   Psychiatric:         Mood and Affect: Mood normal.         Behavior: Behavior normal.           MDM and ED Course     ED Medication Orders (From admission, onward)    None             MDM                 Procedures    Clinical Impression & Disposition  Clinical Impression  Final diagnoses:   Toothache   Dental abscess        ED Disposition     ED Disposition Condition Date/Time Comment    Discharge  Wed Jan 31, 2019  5:57 AM Emmaline Life discharge to home/self care.    Condition at disposition: Stable             Current Discharge Medication List      START taking these medications    Details   amoxicillin (AMOXIL) 500 MG capsule Take 1 capsule (500 mg total) by mouth 3 (three) times daily for 10 days  Qty: 30 capsule, Refills: 0      methylPREDNISolone (MEDROL DOSPACK) 4 MG tablet Take as directed  Qty: 21 tablet, Refills: 0                       Skip Estimable, MD  01/31/19 [phone]

## 2019-01-31 NOTE — EDIE (Signed)
COLLECTIVE?NOTIFICATION?01/31/2019 05:06?Michael Williamson, Michael Williamson?MRN: [id number]    Criteria Met      High Utilization (6+ED/6 Months)    Security and Safety  No recent Security Events currently on file    ED Care Guidelines  There are currently no ED Care Guidelines for this patient. Please check your facility's medical records system.    Flags      History of Sepsis - Patient has received a diagnosis of Sepsis from an acute or post-acute setting. Apply appropriate clinical planning practices; to learn more visit [email or web address].[email or web address] / Attributed By: [facility] / Attributed On: 11/08/2018       Prescription Monitoring Program  180??- Narcotic Use Score  110??- Sedative Use Score  000??- Stimulant Use Score  450??- Overdose Risk Score  - All Scores range from 000-999 with 75% of the population scoring < 200 and on 1% scoring above 650  - The last digit of the narcotic, sedative, and stimulant score indicates the number of active prescriptions of that type  - Higher Use scores correlate with increased prescribers, pharmacies, mg equiv, and overlapping prescriptions  - Higher Overdose Risk Scores correlate with increased risk of unintentional overdose death   Concerning or unexpectedly high scores should prompt a review of the PMP record; this does not constitute checking PMP for prescribing purposes.      E.Williamson. Visit Count (12 mo.)  Facility Visits   [facility] 1   [facility] 2   [facility] 1   [facility] 1   [facility] [address] 1   [facility] 1   [facility] 1   [facility] 1   Total 9   Note: Visits indicate total known visits.      Recent Emergency Department Visit Summary  Date Facility [facility] Type Diagnoses or Chief Complaint   Jan 31, 2019 [facility] [place] [place] [facility]      Dental Pain      Nov 08, 2018 [facility] [place]. [place] [facility]      RIGTH LEG NUMB      Leg Pain      Pain in right leg      Sciatica, right side      Oct 22, 2018 [facility] [place] [place] [facility]      SOB/EMS      SHORTNESS OF BREATH FEVER 9 WEEKS TO 74 YEARS      SHORTNESS OF BREATH FEVER 9 WEEKS TO 7      Acute respiratory failure with hypoxia      Oct 11, 2018 [facility] [address] [place]. [place] [facility]      Difficulty Breathing      SHORTNESS OF BREATH      Unspecified atrial fibrillation      Sep 26, 2018 [facility] [place] [facility] [place] [place] [facility]      Swollen throat      Infectious mononucleosis, unspecified without complication      Acute tonsillitis, unspecified      Sep 25, 2018 [facility] [place] [place]. [place] [facility]      Acute pharyngitis due to other specified organisms      Sep 18, 2018 [facility] [place] [facility] [place] [place] [facility]      Right side back pain  Unspecified abdominal pain      Aug 13, 2018 [facility] [place] [facility]      Shortness of breath      Bronchitis, not specified as acute or chronic      Other general symptoms and signs      Contact with and (suspected) exposure to other viral communicable diseases      Apr 28, 2018 [facility] [place] [facility]      Chest pain, unspecified          Recent Inpatient Visit Summary  Date Facility [facility] Type Diagnoses or Chief Complaint   Oct 22, 2018 [facility] [place] [place] [facility]      RESPIRATORY FAILURE HCC      Difficulty in walking, not elsewhere classified      SHORTNESS OF BREATH FEVER 9 WEEKS TO 74 YEARS      2. Acute respiratory failure with hypoxia      2. Unspecified bacterial pneumonia      2. Sepsis, unspecified organism      3. Pneumonia, unspecified organism      4. Body mass index [BMI]40.0-44.9, adult      5. Chronic obstructive pulmonary disease with (acute) lower respiratory infection      5.  Severe sepsis with septic shock      Oct 11, 2018 [facility] [place] [facility]      SHORTNESS OF BREATH      1. Unspecified atrial fibrillation      2. Paroxysmal atrial fibrillation      3. Pneumonia, unspecified organism      4. Body mass index [BMI]40.0-44.9, adult      5. Chronic obstructive pulmonary disease with (acute) exacerbation      6. Chronic obstructive pulmonary disease with (acute) lower respiratory infection      7. Supraventricular tachycardia      8. Contact with and (suspected) exposure to other viral communicable diseases      9. Encounter for immunization          Care Team  There is not a care team on record at this time.   [facility] Portal  This patient has registered at the [facility] Emergency Department   For more information visit: [email or web address]     PLEASE NOTE:     1.   Any care recommendations and other clinical information are provided as guidelines or for historical purposes only, and providers should exercise their own clinical judgment when providing care.    2.   You may only use this information for purposes of treatment, payment or health care operations activities, and subject to the limitations of applicable [employer] Policies.    3.   You should consult directly with the organization that provided a care guideline or other clinical history with any questions about additional information or accuracy or completeness of information provided.    ? 2020 [place], [employer] - [email or web address]

## 2020-11-17 ENCOUNTER — Emergency Department (HOSPITAL_COMMUNITY)
Admission: EM | Admit: 2020-11-17 | Discharge: 2020-11-18 | Disposition: A | Discharge status: Home / Self Care | Payer: Medicaid - Out of State | Attending: Emergency Medicine | Admitting: Emergency Medicine

## 2020-11-17 ENCOUNTER — Emergency Department (HOSPITAL_COMMUNITY): Payer: Medicaid - Out of State

## 2020-11-17 ENCOUNTER — Encounter (HOSPITAL_COMMUNITY): Payer: Self-pay

## 2020-11-17 ENCOUNTER — Other Ambulatory Visit: Payer: Self-pay

## 2020-11-17 DIAGNOSIS — R2243 Localized swelling, mass and lump, lower limb, bilateral: Secondary | ICD-10-CM | POA: Insufficient documentation

## 2020-11-17 DIAGNOSIS — I110 Hypertensive heart disease with heart failure: Secondary | ICD-10-CM | POA: Diagnosis not present

## 2020-11-17 DIAGNOSIS — R0602 Shortness of breath: Secondary | ICD-10-CM | POA: Insufficient documentation

## 2020-11-17 DIAGNOSIS — Z7982 Long term (current) use of aspirin: Secondary | ICD-10-CM | POA: Diagnosis not present

## 2020-11-17 DIAGNOSIS — F17210 Nicotine dependence, cigarettes, uncomplicated: Secondary | ICD-10-CM | POA: Insufficient documentation

## 2020-11-17 DIAGNOSIS — I509 Heart failure, unspecified: Secondary | ICD-10-CM | POA: Diagnosis not present

## 2020-11-17 DIAGNOSIS — R0789 Other chest pain: Secondary | ICD-10-CM

## 2020-11-17 DIAGNOSIS — Z20822 Contact with and (suspected) exposure to covid-19: Secondary | ICD-10-CM | POA: Insufficient documentation

## 2020-11-17 DIAGNOSIS — R062 Wheezing: Secondary | ICD-10-CM | POA: Diagnosis not present

## 2020-11-17 HISTORY — DX: Essential (primary) hypertension: I10

## 2020-11-17 HISTORY — DX: Heart failure, unspecified: I50.9

## 2020-11-17 LAB — BASIC METABOLIC PANEL
Anion gap: 8 (ref 5–15)
BUN: 18 mg/dL (ref 6–20)
CO2: 26 mmol/L (ref 22–32)
Calcium: 8.4 mg/dL — ABNORMAL LOW (ref 8.9–10.3)
Chloride: 100 mmol/L (ref 98–111)
Creatinine, Ser: 1.01 mg/dL (ref 0.61–1.24)
GFR, Estimated: >60 mL/min (ref >60)
Glucose, Bld: 128 mg/dL — ABNORMAL HIGH (ref 70–99)
Potassium: 3.2 mmol/L — ABNORMAL LOW (ref 3.5–5.1)
Sodium: 134 mmol/L — ABNORMAL LOW (ref 135–145)

## 2020-11-17 LAB — CBC
HCT: 45.2 % (ref 39.0–52.0)
Hemoglobin: 15 g/dL (ref 13.0–17.0)
MCH: 31.7 pg (ref 26.0–34.0)
MCHC: 33.2 g/dL (ref 30.0–36.0)
MCV: 95.6 fL (ref 80.0–100.0)
Platelets: 194 10*3/uL (ref 150–400)
RBC: 4.73 MIL/uL (ref 4.22–5.81)
RDW: 12.8 % (ref 11.5–15.5)
WBC: 9.6 10*3/uL (ref 4.0–10.5)
nRBC: 0 % (ref 0.0–0.2)

## 2020-11-17 LAB — RESP PANEL BY RT-PCR (FLU A&B, COVID) ARPGX2
Influenza A by PCR: NEGATIVE
Influenza B by PCR: NEGATIVE
SARS Coronavirus 2 by RT PCR: NEGATIVE

## 2020-11-17 LAB — TROPONIN I (HIGH SENSITIVITY): Troponin I (High Sensitivity): 3 ng/L (ref <18)

## 2020-11-17 MED ORDER — FUROSEMIDE 10 MG/ML IJ SOLN
80.0000 mg | Freq: Once | INTRAMUSCULAR | Status: AC
  Administered 2020-11-18: 80 mg via INTRAVENOUS
  Filled 2020-11-17: qty 8

## 2020-11-17 NOTE — ED Triage Notes (Signed)
Pt states that he has had chest pain and SOB since about 5pm. Hx of CHF

## 2020-11-17 NOTE — ED Provider Notes (Signed)
[facility] EMERGENCY DEPARTMENT Provider Note   CSN: [id number] Arrival date & time: 11/17/20  2133     History Chief Complaint  Patient presents with   Chest Pain    John Clark is a 50 y.o. male.  HPI     This a 50 year old male with history of A. fib, CHF, hypertension who presents with chest pain and shortness of breath.  Patient reports worsening chest discomfort.  He describes it as pressure.  He states radiates into his right arm.  He also reports shortness of breath.  He cannot differentiate whether it is worse when laying flat.  He reports he is taking his torsemide but "not peeing well."  He states that he feels like he is "drowning in fluid."  Denies cough or fevers.  Currently he rates his pain at 7 out of 10.  Past Medical History:  Diagnosis Date   Aneurysm (HCC)    Atrial fibrillation (HCC) 2020   CHF (congestive heart failure) (HCC)    Chronic back pain    Chronic lumbar pain    Degenerative disc disease    GERD (gastroesophageal reflux disease)    Hypertension    Irregular heartbeat    Rectal polyp     There are no problems to display for this patient.   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 Authorizing Provider  aspirin EC 81 MG tablet Take 81 mg by mouth daily.    [provider]  HYDROcodone-acetaminophen (NORCO) 5-325 MG tablet Take 1-2 tablets by mouth every 4  (four) hours as needed. 04/28/18   Geoffery Lyons, MD    Allergies    Codeine, Ibuprofen, Ketorolac tromethamine, Nsaids, and Prednisone  Review of Systems   Review of Systems  Constitutional:  Negative for fever.  Respiratory:  Positive for shortness of breath. Negative for cough.   Cardiovascular:  Positive for chest pain and leg swelling.  Gastrointestinal:  Negative for abdominal pain, nausea and vomiting.  Genitourinary:  Negative for dysuria.  All other systems reviewed and are negative.  Physical Exam Updated Vital Signs BP 119/77   Pulse 82   Temp 97.8 F (36.6 C) (Oral)   Resp 19   Ht 1.753 m (5\' 9" )   Wt (!) 158.8 kg   SpO2 93%   BMI 51.69 kg/m   Physical Exam Vitals and nursing note reviewed.  Constitutional:      Appearance: He is well-developed. He is obese. He is not ill-appearing.  HENT:     Head: Normocephalic and atraumatic.  Eyes:     Pupils: Pupils are equal, round, and reactive to light.  Cardiovascular:     Rate and Rhythm: Normal rate and regular rhythm.     Heart sounds: Normal  heart sounds. No murmur heard. Pulmonary:     Effort: Pulmonary effort is normal. No respiratory distress.     Breath sounds: Wheezing and rhonchi present.  Abdominal:     General: Bowel sounds are normal.     Palpations: Abdomen is soft.     Tenderness: There is no abdominal tenderness. There is no rebound.  Musculoskeletal:     Cervical back: Neck supple.     Right lower leg: Edema present.     Left lower leg: Edema present.  Lymphadenopathy:     Cervical: No cervical adenopathy.  Skin:    General: Skin is warm and dry.  Neurological:     Mental Status: He is alert and oriented to person, place, and time.  Psychiatric:        Mood and Affect: Mood normal.    ED Results / Procedures / Treatments   Labs (all labs ordered are listed, but only abnormal results are displayed) Labs Reviewed  BASIC METABOLIC PANEL - Abnormal; Notable for the following components:       Result Value   Sodium 134 (*)    Potassium 3.2 (*)    Glucose, Bld 128 (*)    Calcium 8.4 (*)    All other components within normal limits  RESP PANEL BY RT-PCR (FLU A&B, COVID) ARPGX2  CBC  BRAIN NATRIURETIC PEPTIDE  TROPONIN I (HIGH SENSITIVITY)  TROPONIN I (HIGH SENSITIVITY)    EKG EKG Interpretation  Date/Time:  Monday November 17 2020 21:47:08 EDT Ventricular Rate:  81 PR Interval:  158 QRS Duration: 84 QT Interval:  378 QTC Calculation: 439 R Axis:   49 Text Interpretation: Normal sinus rhythm Possible Inferior infarct , age undetermined Abnormal ECG T wave inversions lead III Confirmed by Ross Marcus ([id number]) on 11/17/2020 10:51:56 PM  Radiology DG Chest 2 View  Result Date: 11/17/2020 CLINICAL DATA:  Shortness of breath EXAM: CHEST - 2 VIEW COMPARISON:  October 02, 2020 FINDINGS: The heart size and mediastinal contours are within normal limits. No focal consolidation. No pleural effusion. No pneumothorax. The visualized skeletal structures are unremarkable. IMPRESSION: No acute cardiopulmonary disease. Electronically Signed   By: Maudry Mayhew M.D.   On: 11/17/2020 22:23    Procedures .Critical Care Performed by: Shon Baton, MD Authorized by: Shon Baton, MD   Critical care provider statement:    Critical care time (minutes):  35   Critical care time was exclusive of:  Separately billable procedures and treating other patients   Critical care was necessary to treat or prevent imminent or life-threatening deterioration of the following conditions:  Cardiac failure   Critical care was time spent personally by me on the following activities:  Blood draw for specimens, ordering and performing treatments and interventions, ordering and review of radiographic studies, ordering and review of laboratory studies, re-evaluation of patient's condition, evaluation of patient's response to treatment and examination of patient   Medications Ordered in  ED Medications  furosemide (LASIX) injection 80 mg (80 mg Intravenous Given 11/18/20 0009)  morphine 4 MG/ML injection 4 mg (4 mg Intravenous Given 11/18/20 0031)  ondansetron (ZOFRAN) injection 4 mg (4 mg Intravenous Given 11/18/20 0031)    ED Course  I have reviewed the triage vital signs and the nursing notes.  Pertinent labs & imaging results that were available during my care of the patient were reviewed by me and considered in my medical decision making (see chart for details).    MDM Rules/Calculators/A&P  Patient presents with shortness of breath and chest discomfort.  He is overall nontoxic and vital signs are reassuring.  He is in no respiratory distress.  He does have some wheezing and rhonchi on exam.  He is not overtly volume overloaded but with his history, would be concern for pulmonary edema.  Other considerations include but less likely infectious etiology.  He is having some chest discomfort.  Reports history of thoracic aneurysm.  I reviewed his chart.  He last had imaging in early September which showed a stable 4 cm thoracic ascending aneurysm.  His pain is atypical and doubt dissection.  Additionally I have reviewed his cardiology notes.  He was increased on his torsemide and has fairly frequent episodes of shortness of breath and chest discomfort.  Recent stress testing was reassuring per cardiology notes although I am unable to review management.  Patient was given morphine for pain.  EKG shows no acute ischemic changes and troponin x2 negative.  This is reassuring.  Doubt primary ACS.  Would consider heart failure exacerbation more likely.  Patient was given 80 mg of IV Lasix with good urine output.  On recheck, he continues to be hemodynamically stable.  He is in no respiratory distress.  He ambulates with pulse ox without difficulty.  Discussed following up closely with his primary cardiologist.  Patient stated understanding.  After history,  exam, and medical workup I feel the patient has been appropriately medically screened and is safe for discharge home. Pertinent diagnoses were discussed with the patient. Patient was given return precautions.  Final Clinical Impression(s) / ED Diagnoses Final diagnoses:  Acute on chronic congestive heart failure, unspecified heart failure type (HCC)  Atypical chest pain    Rx / DC Orders ED Discharge Orders     None        Shon Baton, MD 11/18/20 0126

## 2020-11-18 LAB — TROPONIN I (HIGH SENSITIVITY): Troponin I (High Sensitivity): 3 ng/L (ref <18)

## 2020-11-18 LAB — BRAIN NATRIURETIC PEPTIDE: B Natriuretic Peptide: 44 pg/mL (ref 0.0–100.0)

## 2020-11-18 MED ORDER — ONDANSETRON HCL 4 MG/2ML IJ SOLN
4.0000 mg | Freq: Once | INTRAMUSCULAR | Status: AC
  Administered 2020-11-18: 4 mg via INTRAVENOUS
  Filled 2020-11-18: qty 2

## 2020-11-18 MED ORDER — MORPHINE SULFATE (PF) 4 MG/ML IV SOLN
4.0000 mg | Freq: Once | INTRAVENOUS | Status: AC
  Administered 2020-11-18: 4 mg via INTRAVENOUS
  Filled 2020-11-18: qty 1

## 2020-11-18 NOTE — ED Notes (Signed)
Pt ambulated around nurse's station with a steady gait and unassisted.  O2 sats 90-91%

## 2020-11-18 NOTE — Discharge Instructions (Addendum)
You were seen today for chest pain and shortness of breath.  You were given a extra dose of Lasix.  Your heart testing is reassuring.  Follow-up closely with your cardiologist for reevaluation.

## 2020-12-09 ENCOUNTER — Institutional Professional Consult (permissible substitution): Payer: Medicaid - Out of State | Admitting: Pulmonary Disease
